# Patient Record
Sex: Male | Born: 1955
Health system: Southern US, Community
[De-identification: ages and names within clinical notes are randomized; demographics above are authoritative.]

## PROBLEM LIST (undated history)

## (undated) DIAGNOSIS — E785 Hyperlipidemia, unspecified: Secondary | ICD-10-CM

## (undated) DIAGNOSIS — C4491 Basal cell carcinoma of skin, unspecified: Secondary | ICD-10-CM

## (undated) DIAGNOSIS — R972 Elevated prostate specific antigen [PSA]: Secondary | ICD-10-CM

## (undated) DIAGNOSIS — K635 Polyp of colon: Secondary | ICD-10-CM

## (undated) DIAGNOSIS — A630 Anogenital (venereal) warts: Secondary | ICD-10-CM

## (undated) DIAGNOSIS — F419 Anxiety disorder, unspecified: Secondary | ICD-10-CM

## (undated) DIAGNOSIS — I10 Essential (primary) hypertension: Secondary | ICD-10-CM

## (undated) HISTORY — DX: Anxiety disorder, unspecified: F41.9

## (undated) HISTORY — PX: ANKLE SURGERY: SHX546

## (undated) HISTORY — DX: Basal cell carcinoma of skin, unspecified: C44.91

## (undated) HISTORY — PX: WRIST SURGERY: SHX841

## (undated) HISTORY — DX: Hyperlipidemia, unspecified: E78.5

## (undated) HISTORY — PX: PROSTATE BIOPSY: SHX241

## (undated) HISTORY — PX: HERNIA REPAIR: SHX51

## (undated) HISTORY — DX: Polyp of colon: K63.5

## (undated) HISTORY — DX: Anogenital (venereal) warts: A63.0

## (undated) HISTORY — PX: MOHS SURGERY: SUR867

## (undated) HISTORY — DX: Essential (primary) hypertension: I10

## (undated) HISTORY — DX: Elevated prostate specific antigen (PSA): R97.20

---
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
## 1986-08-12 HISTORY — PX: ANKLE SURGERY: SHX546

## 2015-05-17 ENCOUNTER — Encounter (HOSPITAL_COMMUNITY): Payer: Self-pay | Admitting: *Deleted

## 2015-05-17 ENCOUNTER — Emergency Department (HOSPITAL_COMMUNITY): Payer: BLUE CROSS/BLUE SHIELD

## 2015-05-17 ENCOUNTER — Emergency Department (HOSPITAL_COMMUNITY)
Admission: EM | Admit: 2015-05-17 | Discharge: 2015-05-18 | Disposition: A | Discharge status: Home / Self Care | Payer: BLUE CROSS/BLUE SHIELD | Attending: Emergency Medicine | Admitting: Emergency Medicine

## 2015-05-17 DIAGNOSIS — Z9889 Other specified postprocedural states: Secondary | ICD-10-CM | POA: Insufficient documentation

## 2015-05-17 DIAGNOSIS — R1010 Upper abdominal pain, unspecified: Secondary | ICD-10-CM | POA: Diagnosis not present

## 2015-05-17 DIAGNOSIS — Z79899 Other long term (current) drug therapy: Secondary | ICD-10-CM | POA: Diagnosis not present

## 2015-05-17 DIAGNOSIS — R079 Chest pain, unspecified: Secondary | ICD-10-CM | POA: Diagnosis present

## 2015-05-17 LAB — COMPREHENSIVE METABOLIC PANEL
ALBUMIN: 4.8 g/dL (ref 3.5–5.0)
ALT: 40 U/L (ref 17–63)
ANION GAP: 9 (ref 5–15)
AST: 42 U/L — ABNORMAL HIGH (ref 15–41)
Alkaline Phosphatase: 62 U/L (ref 38–126)
BUN: 14 mg/dL (ref 6–20)
CHLORIDE: 102 mmol/L (ref 101–111)
CO2: 29 mmol/L (ref 22–32)
Calcium: 9.2 mg/dL (ref 8.9–10.3)
Creatinine, Ser: 1.07 mg/dL (ref 0.61–1.24)
GFR calc Af Amer: >60 mL/min (ref >60)
GFR calc non Af Amer: >60 mL/min (ref >60)
GLUCOSE: 112 mg/dL — AB (ref 65–99)
POTASSIUM: 3.7 mmol/L (ref 3.5–5.1)
SODIUM: 140 mmol/L (ref 135–145)
Total Bilirubin: 0.9 mg/dL (ref 0.3–1.2)
Total Protein: 7.8 g/dL (ref 6.5–8.1)

## 2015-05-17 LAB — CBC
HEMATOCRIT: 44.1 % (ref 39.0–52.0)
Hemoglobin: 15.2 g/dL (ref 13.0–17.0)
MCH: 32 pg (ref 26.0–34.0)
MCHC: 34.5 g/dL (ref 30.0–36.0)
MCV: 92.8 fL (ref 78.0–100.0)
PLATELETS: 169 10*3/uL (ref 150–400)
RBC: 4.75 MIL/uL (ref 4.22–5.81)
RDW: 12.4 % (ref 11.5–15.5)
WBC: 9.5 10*3/uL (ref 4.0–10.5)

## 2015-05-17 LAB — I-STAT TROPONIN, ED: Troponin i, poc: 0.01 ng/mL (ref 0.00–0.08)

## 2015-05-17 LAB — LIPASE, BLOOD: Lipase: 22 U/L (ref 22–51)

## 2015-05-17 MED ORDER — ASPIRIN 81 MG PO CHEW
324.0000 mg | CHEWABLE_TABLET | Freq: Once | ORAL | Status: AC
  Administered 2015-05-17: 324 mg via ORAL
  Filled 2015-05-17: qty 4

## 2015-05-17 MED ORDER — GI COCKTAIL ~~LOC~~
30.0000 mL | Freq: Once | ORAL | Status: AC
  Administered 2015-05-17: 30 mL via ORAL
  Filled 2015-05-17: qty 30

## 2015-05-17 NOTE — ED Notes (Signed)
Pt states that around 1730 he began having burning and pressure to his chest; pt states it felt like something needed to "explode"; pt states that the pain radiated to his back; pt denies [facility] but states "It felt difficult to take a breath"; pt states that the pain has eased some but has not gone away

## 2015-05-17 NOTE — ED Provider Notes (Signed)
CSN: [id number]     Arrival date & time 05/17/15  2016 History   First MD Initiated Contact with Patient 05/17/15 2054     Chief Complaint  Patient presents with  . Chest Pain     (Consider location/radiation/quality/duration/timing/severity/associated sxs/prior Treatment) Patient is a 59 y.o. male presenting with chest pain. The history is provided by the patient.  Chest Pain Pain location:  Substernal area Pain quality: aching and dull   Pain quality comment:  Intense Pain radiates to:  Does not radiate Pain severity:  Severe Onset quality:  Sudden Duration: 3 hours. Timing:  Constant Progression:  Improving Chronicity:  New Relieved by:  Nothing Worsened by:  Nothing tried Ineffective treatments:  None tried Associated symptoms: no diaphoresis, no fever, no lower extremity edema, no shortness of breath and not vomiting     History reviewed. No pertinent past medical history. Past Surgical History  Procedure Laterality Date  . Hernia repair    . Ankle surgery     No family history on file. Social History  Substance Use Topics  . Smoking status: Never Smoker   . Smokeless tobacco: None  . Alcohol Use: Yes     Comment: socially    Review of Systems  Constitutional: Negative for fever and diaphoresis.  Respiratory: Negative for shortness of breath.   Cardiovascular: Positive for chest pain.  Gastrointestinal: Negative for vomiting.      Allergies  Quinine derivatives  Home Medications   Prior to Admission medications   Medication Sig Start Date End Date Taking? Authorizing Provider  Cholecalciferol (VITAMIN D) 2000 UNITS tablet Take 2,000 Units by mouth daily.   Yes Historical Provider, MD  LORazepam (ATIVAN) 0.5 MG tablet Take 0.5 mg by mouth every 6 (six) hours as needed for anxiety.   Yes Historical Provider, MD  Melatonin 5 MG TABS Take 1 tablet by mouth at bedtime.   Yes Historical Provider, MD  Omega-3 Fatty Acids (FISH OIL PO) Take 900 mg by mouth  daily.   Yes Historical Provider, MD   BP 161/73 mmHg  Pulse 58  Temp(Src) 98.3 F (36.8 C) (Oral)  Resp 16  Ht 5\' 10"  (1.778 m)  Wt 185 lb (83.915 kg)  BMI 26.54 kg/m2  SpO2 98% Physical Exam  Constitutional: He is oriented to person, place, and time. He appears well-developed and well-nourished. No distress.  HENT:  Head: Normocephalic and atraumatic.  Eyes: Conjunctivae are normal.  Neck: Neck supple. No tracheal deviation present.  Cardiovascular: Normal rate, regular rhythm and normal heart sounds.   Pulmonary/Chest: Effort normal and breath sounds normal. No respiratory distress. He has no wheezes. He has no rales. He exhibits no tenderness.  Abdominal: Soft. He exhibits no distension. There is tenderness (mild upper, non-focal). There is no rebound and no guarding.  Neurological: He is alert and oriented to person, place, and time.  Skin: Skin is warm and dry.  Psychiatric: He has a normal mood and affect.    ED Course  Procedures (including critical care time) Labs Review Labs Reviewed  COMPREHENSIVE METABOLIC PANEL - Abnormal; Notable for the following:    Glucose, Bld 112 (*)    AST 42 (*)    All other components within normal limits  CBC  LIPASE, BLOOD  I-STAT TROPOININ, ED    Imaging Review Dg Chest 2 View  05/17/2015   CLINICAL DATA:  Acute onset of mid chest pain.  Initial encounter.  EXAM: CHEST  2 VIEW  COMPARISON:  None.  FINDINGS: The lungs are well-aerated and clear. There is no evidence of focal opacification, pleural effusion or pneumothorax.  The heart is normal in size; the mediastinal contour is within normal limits. No acute osseous abnormalities are seen.  IMPRESSION: No acute cardiopulmonary process seen.   Electronically Signed   By: Garald Balding M.D.   On: 05/17/2015 21:31   I have personally reviewed and evaluated these images and lab results as part of my medical decision-making.   EKG Interpretation   Date/Time:  Wednesday May 17 2015 20:42:16 EDT Ventricular Rate:  57 PR Interval:  147 QRS Duration: 114 QT Interval:  448 QTC Calculation: 436 R Axis:   -35 Text Interpretation:  Sinus rhythm Borderline IVCD with LAD No previous  tracing Confirmed by Jupiter Boys MD, Quillian Quince ([id number]) on 05/17/2015 9:23:40 PM      MDM   Final diagnoses:  Chest pain, unspecified chest pain type  Upper abdominal pain    59 year old male presents with chest pain that started suddenly around 5:30 PM, felt like an intense pressure over his sternal area. It migrated into his upper abdomen. He had some mild tenderness was noted by his wife who is at bedside who happens to be a family physician. Patient has heart score of 1 and no signs of ischemia on EKG. Pain is very atypical for PE and patient is Wells of 0. No evidence of pancreatitis, acute gallbladder dysfunction, nonfocal tenderness of upper abdomen more likely related to indigestion or similar. First troponin is negative but due to onset of symptoms less than 6 hours prior to arrival patient will require serial troponin examination for low risk ACS rule out.  Second troponin ordered for 12:45 AM, discharge if negative.    Leo Grosser, MD 05/17/15 [phone]

## 2015-05-18 LAB — I-STAT TROPONIN, ED: TROPONIN I, POC: 0.04 ng/mL (ref 0.00–0.08)

## 2015-05-18 NOTE — Discharge Instructions (Signed)
Nonspecific Chest Pain  °Chest pain can be caused by many different conditions. There is always a chance that your pain could be related to something serious, such as a heart attack or a blood clot in your lungs. Chest pain can also be caused by conditions that are not life-threatening. If you have chest pain, it is very important to follow up with your health care provider. °CAUSES  °Chest pain can be caused by: °· Heartburn. °· Pneumonia or bronchitis. °· Anxiety or stress. °· Inflammation around your heart (pericarditis) or lung (pleuritis or pleurisy). °· A blood clot in your lung. °· A collapsed lung (pneumothorax). It can develop suddenly on its own (spontaneous pneumothorax) or from trauma to the chest. °· Shingles infection (varicella-zoster virus). °· Heart attack. °· Damage to the bones, muscles, and cartilage that make up your chest wall. This can include: °¨ Bruised bones due to injury. °¨ Strained muscles or cartilage due to frequent or repeated coughing or overwork. °¨ Fracture to one or more ribs. °¨ Sore cartilage due to inflammation (costochondritis). °RISK FACTORS  °Risk factors for chest pain may include: °· Activities that increase your risk for trauma or injury to your chest. °· Respiratory infections or conditions that cause frequent coughing. °· Medical conditions or overeating that can cause heartburn. °· Heart disease or family history of heart disease. °· Conditions or health behaviors that increase your risk of developing a blood clot. °· Having had chicken pox (varicella zoster). °SIGNS AND SYMPTOMS °Chest pain can feel like: °· Burning or tingling on the surface of your chest or deep in your chest. °· Crushing, pressure, aching, or squeezing pain. °· Dull or sharp pain that is worse when you move, cough, or take a deep breath. °· Pain that is also felt in your back, neck, shoulder, or arm, or pain that spreads to any of these areas. °Your chest pain may come and go, or it may stay  constant. °DIAGNOSIS °Lab tests or other studies may be needed to find the cause of your pain. Your health care provider may have you take a test called an ambulatory ECG (electrocardiogram). An ECG records your heartbeat patterns at the time the test is performed. You may also have other tests, such as: °· Transthoracic echocardiogram (TTE). During echocardiography, sound waves are used to create a picture of all of the heart structures and to look at how blood flows through your heart. °· Transesophageal echocardiogram (TEE). This is a more advanced imaging test that obtains images from inside your body. It allows your health care provider to see your heart in finer detail. °· Cardiac monitoring. This allows your health care provider to monitor your heart rate and rhythm in real time. °· Holter monitor. This is a portable device that records your heartbeat and can help to diagnose abnormal heartbeats. It allows your health care provider to track your heart activity for several days, if needed. °· Stress tests. These can be done through exercise or by taking medicine that makes your heart beat more quickly. °· Blood tests. °· Imaging tests. °TREATMENT  °Your treatment depends on what is causing your chest pain. Treatment may include: °· Medicines. These may include: °¨ Acid blockers for heartburn. °¨ Anti-inflammatory medicine. °¨ Pain medicine for inflammatory conditions. °¨ Antibiotic medicine, if an infection is present. °¨ Medicines to dissolve blood clots. °¨ Medicines to treat coronary artery disease. °· Supportive care for conditions that do not require medicines. This may include: °¨ Resting. °¨ Applying heat   or cold packs to injured areas. °¨ Limiting activities until pain decreases. °HOME CARE INSTRUCTIONS °· If you were prescribed an antibiotic medicine, finish it all even if you start to feel better. °· Avoid any activities that bring on chest pain. °· Do not use any tobacco products, including  cigarettes, chewing tobacco, or electronic cigarettes. If you need help quitting, ask your health care provider. °· Do not drink alcohol. °· Take medicines only as directed by your health care provider. °· Keep all follow-up visits as directed by your health care provider. This is important. This includes any further testing if your chest pain does not go away. °· If heartburn is the cause for your chest pain, you may be told to keep your head raised (elevated) while sleeping. This reduces the chance that acid will go from your stomach into your esophagus. °· Make lifestyle changes as directed by your health care provider. These may include: °¨ Getting regular exercise. Ask your health care provider to suggest some activities that are safe for you. °¨ Eating a heart-healthy diet. A registered dietitian can help you to learn healthy eating options. °¨ Maintaining a healthy weight. °¨ Managing diabetes, if necessary. °¨ Reducing stress. °SEEK MEDICAL CARE IF: °· Your chest pain does not go away after treatment. °· You have a rash with blisters on your chest. °· You have a fever. °SEEK IMMEDIATE MEDICAL CARE IF:  °· Your chest pain is worse. °· You have an increasing cough, or you cough up blood. °· You have severe abdominal pain. °· You have severe weakness. °· You faint. °· You have chills. °· You have sudden, unexplained chest discomfort. °· You have sudden, unexplained discomfort in your arms, back, neck, or jaw. °· You have shortness of breath at any time. °· You suddenly start to sweat, or your skin gets clammy. °· You feel nauseous or you vomit. °· You suddenly feel light-headed or dizzy. °· Your heart begins to beat quickly, or it feels like it is skipping beats. °These symptoms may represent a serious problem that is an emergency. Do not wait to see if the symptoms will go away. Get medical help right away. Call your local emergency services (911 in the [place] Do not drive yourself to the hospital. °  °This  information is not intended to replace advice given to you by your health care provider. Make sure you discuss any questions you have with your health care provider. °  °Document Released: 05/08/2005 Document Revised: 08/19/2014 Document Reviewed: 03/04/2014 °Elsevier Interactive Patient Education ©2016 [employer] ° °

## 2016-11-12 IMAGING — CR DG CHEST 2V
2 series · 2 of 2 positions shown · non-contrast
Comparison: None.

CLINICAL DATA: Acute onset of mid chest pain.  Initial encounter.

EXAM:
CHEST  2 VIEW

[w chest pa]
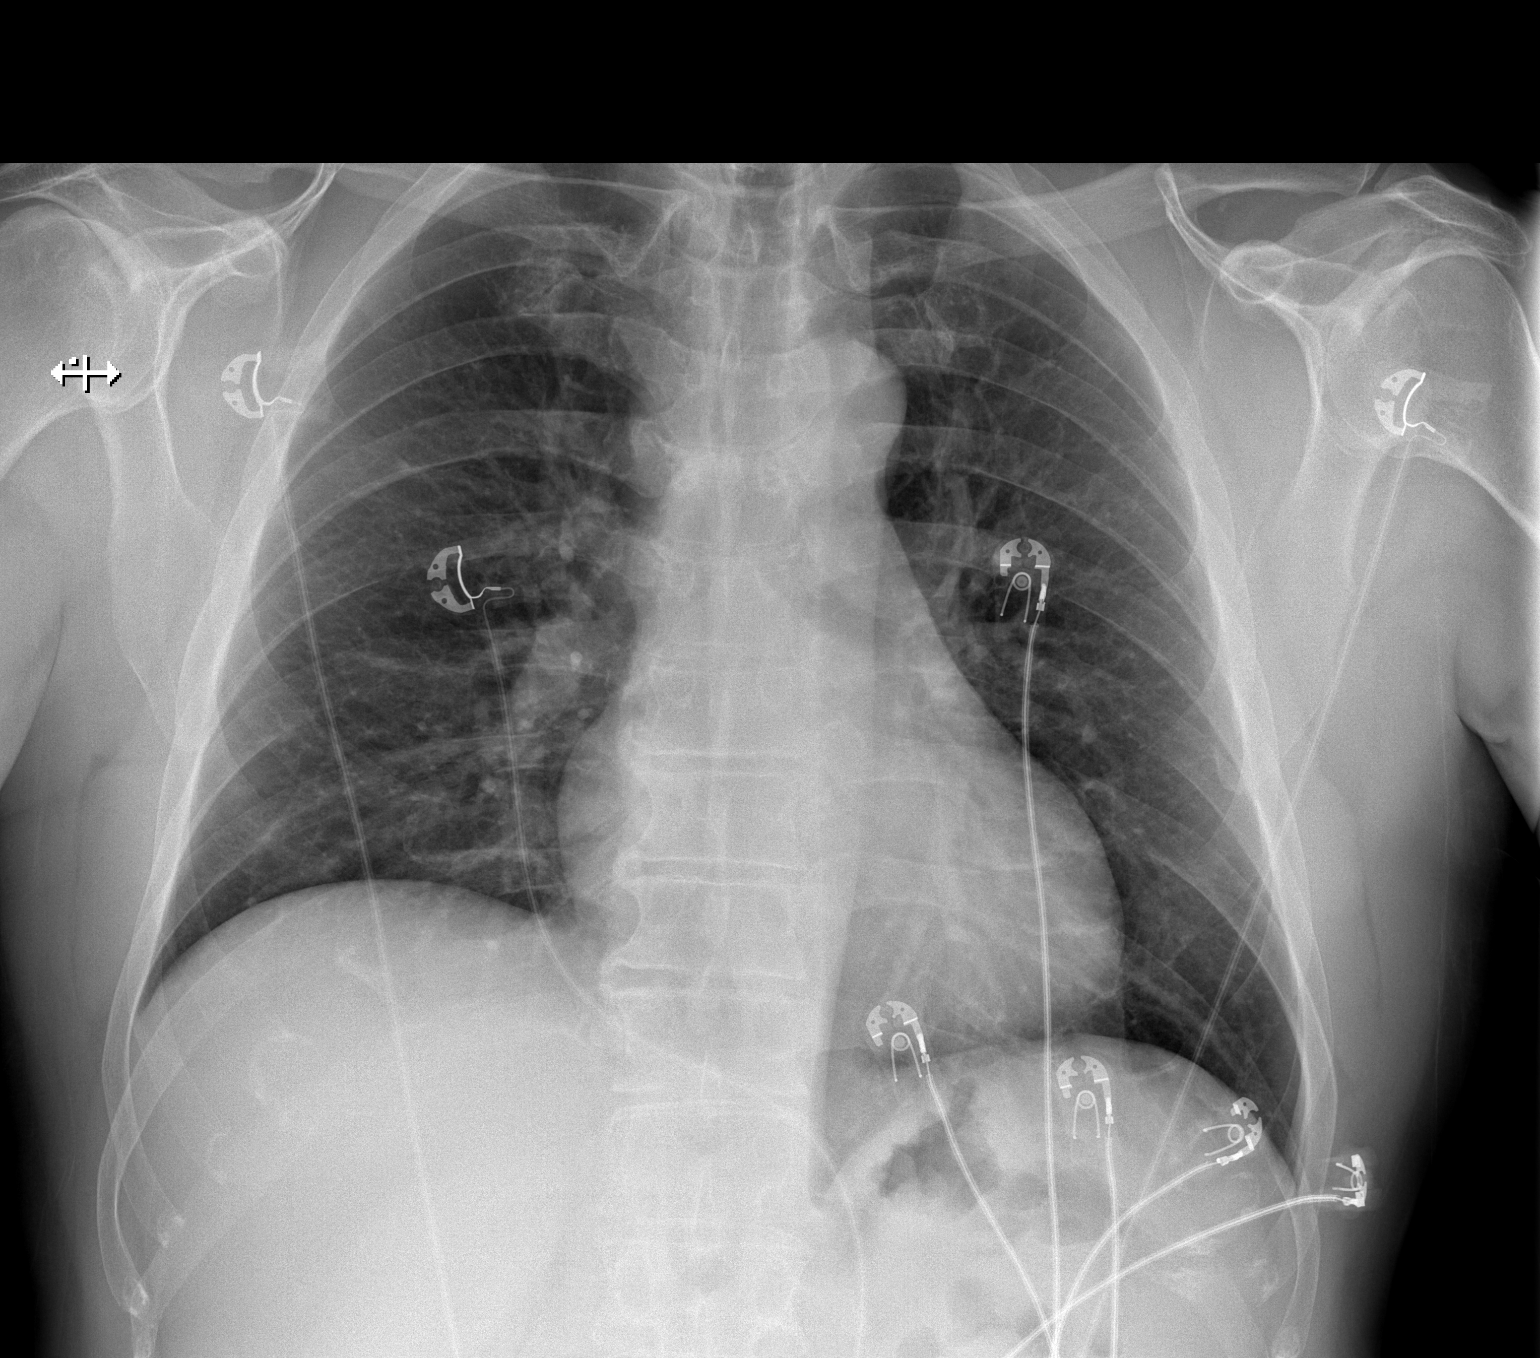

[w chest lat]
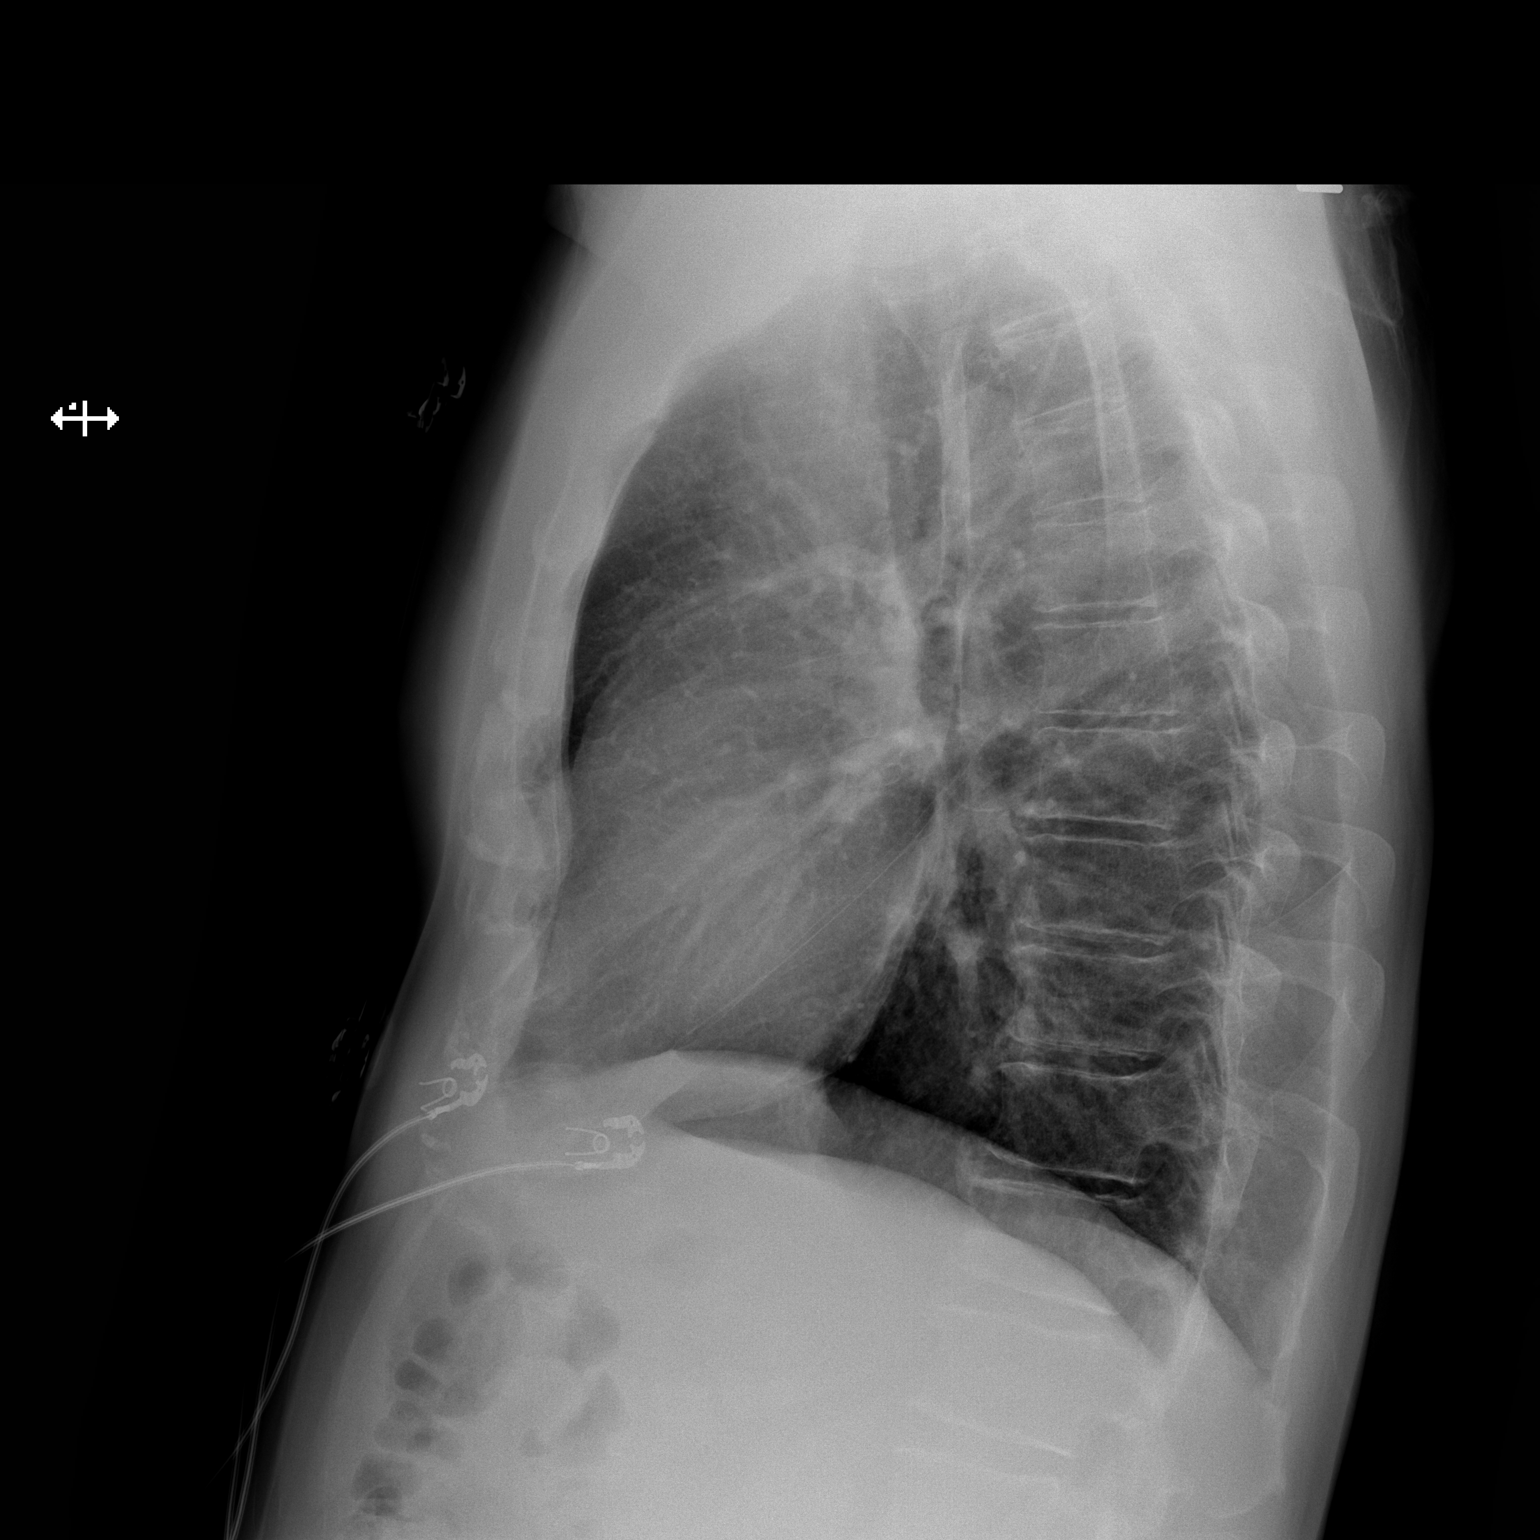

[2 of 2 positions shown; findings below may reference images not displayed]

FINDINGS: The lungs are well-aerated and clear. There is no evidence of focal
opacification, pleural effusion or pneumothorax.

The heart is normal in size; the mediastinal contour is within
normal limits. No acute osseous abnormalities are seen.
IMPRESSION: No acute cardiopulmonary process seen.

## 2017-02-06 DIAGNOSIS — R030 Elevated blood-pressure reading, without diagnosis of hypertension: Secondary | ICD-10-CM | POA: Diagnosis not present

## 2017-02-06 DIAGNOSIS — Z125 Encounter for screening for malignant neoplasm of prostate: Secondary | ICD-10-CM | POA: Diagnosis not present

## 2017-02-06 DIAGNOSIS — Z0000 Encounter for general adult medical examination without abnormal findings: Secondary | ICD-10-CM | POA: Diagnosis not present

## 2017-02-06 DIAGNOSIS — Z130 Encounter for screening for diseases of the blood and blood-forming organs and certain disorders involving the immune mechanism: Secondary | ICD-10-CM | POA: Diagnosis not present

## 2017-02-06 DIAGNOSIS — Z13220 Encounter for screening for lipoid disorders: Secondary | ICD-10-CM | POA: Diagnosis not present

## 2017-03-28 DIAGNOSIS — Z800 Family history of malignant neoplasm of digestive organs: Secondary | ICD-10-CM | POA: Diagnosis not present

## 2017-06-24 DIAGNOSIS — D485 Neoplasm of uncertain behavior of skin: Secondary | ICD-10-CM | POA: Diagnosis not present

## 2017-06-24 DIAGNOSIS — L989 Disorder of the skin and subcutaneous tissue, unspecified: Secondary | ICD-10-CM | POA: Diagnosis not present

## 2017-06-24 DIAGNOSIS — C4441 Basal cell carcinoma of skin of scalp and neck: Secondary | ICD-10-CM | POA: Diagnosis not present

## 2017-06-24 DIAGNOSIS — C44319 Basal cell carcinoma of skin of other parts of face: Secondary | ICD-10-CM | POA: Diagnosis not present

## 2017-08-12 DIAGNOSIS — C4442 Squamous cell carcinoma of skin of scalp and neck: Secondary | ICD-10-CM

## 2017-08-12 HISTORY — DX: Squamous cell carcinoma of skin of scalp and neck: C44.42

## 2017-08-12 HISTORY — PX: MOHS SURGERY: SUR867

## 2017-08-14 DIAGNOSIS — C44319 Basal cell carcinoma of skin of other parts of face: Secondary | ICD-10-CM | POA: Diagnosis not present

## 2017-08-14 DIAGNOSIS — C4441 Basal cell carcinoma of skin of scalp and neck: Secondary | ICD-10-CM | POA: Diagnosis not present

## 2018-02-18 DIAGNOSIS — Z0000 Encounter for general adult medical examination without abnormal findings: Secondary | ICD-10-CM | POA: Diagnosis not present

## 2018-02-18 DIAGNOSIS — R972 Elevated prostate specific antigen [PSA]: Secondary | ICD-10-CM | POA: Diagnosis not present

## 2018-02-18 DIAGNOSIS — E782 Mixed hyperlipidemia: Secondary | ICD-10-CM | POA: Diagnosis not present

## 2018-02-18 DIAGNOSIS — Z1321 Encounter for screening for nutritional disorder: Secondary | ICD-10-CM | POA: Diagnosis not present

## 2018-05-22 DIAGNOSIS — Z800 Family history of malignant neoplasm of digestive organs: Secondary | ICD-10-CM | POA: Diagnosis not present

## 2018-05-22 DIAGNOSIS — D123 Benign neoplasm of transverse colon: Secondary | ICD-10-CM | POA: Diagnosis not present

## 2018-05-22 DIAGNOSIS — Z1211 Encounter for screening for malignant neoplasm of colon: Secondary | ICD-10-CM | POA: Diagnosis not present

## 2018-05-22 LAB — HM COLONOSCOPY

## 2018-06-12 DIAGNOSIS — C4442 Squamous cell carcinoma of skin of scalp and neck: Secondary | ICD-10-CM | POA: Diagnosis not present

## 2018-06-12 DIAGNOSIS — L570 Actinic keratosis: Secondary | ICD-10-CM | POA: Diagnosis not present

## 2018-06-12 DIAGNOSIS — L0889 Other specified local infections of the skin and subcutaneous tissue: Secondary | ICD-10-CM | POA: Diagnosis not present

## 2018-06-23 DIAGNOSIS — C4442 Squamous cell carcinoma of skin of scalp and neck: Secondary | ICD-10-CM | POA: Diagnosis not present

## 2018-06-24 DIAGNOSIS — C4442 Squamous cell carcinoma of skin of scalp and neck: Secondary | ICD-10-CM | POA: Insufficient documentation

## 2018-06-24 DIAGNOSIS — Z888 Allergy status to other drugs, medicaments and biological substances status: Secondary | ICD-10-CM | POA: Diagnosis not present

## 2018-06-26 DIAGNOSIS — D6489 Other specified anemias: Secondary | ICD-10-CM | POA: Diagnosis not present

## 2018-06-26 DIAGNOSIS — C4490 Unspecified malignant neoplasm of skin, unspecified: Secondary | ICD-10-CM | POA: Diagnosis not present

## 2018-06-26 DIAGNOSIS — E559 Vitamin D deficiency, unspecified: Secondary | ICD-10-CM | POA: Diagnosis not present

## 2018-06-26 DIAGNOSIS — R799 Abnormal finding of blood chemistry, unspecified: Secondary | ICD-10-CM | POA: Diagnosis not present

## 2018-07-03 DIAGNOSIS — C4442 Squamous cell carcinoma of skin of scalp and neck: Secondary | ICD-10-CM | POA: Diagnosis not present

## 2018-07-04 DIAGNOSIS — T8189XA Other complications of procedures, not elsewhere classified, initial encounter: Secondary | ICD-10-CM | POA: Diagnosis not present

## 2018-07-04 DIAGNOSIS — C4442 Squamous cell carcinoma of skin of scalp and neck: Secondary | ICD-10-CM | POA: Diagnosis not present

## 2018-07-05 DIAGNOSIS — T8189XA Other complications of procedures, not elsewhere classified, initial encounter: Secondary | ICD-10-CM | POA: Diagnosis not present

## 2018-07-06 DIAGNOSIS — T8189XA Other complications of procedures, not elsewhere classified, initial encounter: Secondary | ICD-10-CM | POA: Diagnosis not present

## 2018-07-07 DIAGNOSIS — T8189XA Other complications of procedures, not elsewhere classified, initial encounter: Secondary | ICD-10-CM | POA: Diagnosis not present

## 2018-07-08 DIAGNOSIS — T8189XA Other complications of procedures, not elsewhere classified, initial encounter: Secondary | ICD-10-CM | POA: Diagnosis not present

## 2018-07-09 DIAGNOSIS — T8189XA Other complications of procedures, not elsewhere classified, initial encounter: Secondary | ICD-10-CM | POA: Diagnosis not present

## 2018-07-10 DIAGNOSIS — T8189XA Other complications of procedures, not elsewhere classified, initial encounter: Secondary | ICD-10-CM | POA: Diagnosis not present

## 2018-07-11 DIAGNOSIS — T8189XA Other complications of procedures, not elsewhere classified, initial encounter: Secondary | ICD-10-CM | POA: Diagnosis not present

## 2018-07-12 DIAGNOSIS — T8189XA Other complications of procedures, not elsewhere classified, initial encounter: Secondary | ICD-10-CM | POA: Diagnosis not present

## 2018-07-13 DIAGNOSIS — T8189XA Other complications of procedures, not elsewhere classified, initial encounter: Secondary | ICD-10-CM | POA: Diagnosis not present

## 2018-07-14 DIAGNOSIS — T8189XA Other complications of procedures, not elsewhere classified, initial encounter: Secondary | ICD-10-CM | POA: Diagnosis not present

## 2018-08-07 DIAGNOSIS — C4442 Squamous cell carcinoma of skin of scalp and neck: Secondary | ICD-10-CM | POA: Diagnosis not present

## 2018-08-08 DIAGNOSIS — C4442 Squamous cell carcinoma of skin of scalp and neck: Secondary | ICD-10-CM | POA: Diagnosis not present

## 2018-08-12 DIAGNOSIS — C439 Malignant melanoma of skin, unspecified: Secondary | ICD-10-CM | POA: Insufficient documentation

## 2019-02-10 DIAGNOSIS — J029 Acute pharyngitis, unspecified: Secondary | ICD-10-CM | POA: Diagnosis not present

## 2019-02-10 DIAGNOSIS — Z20828 Contact with and (suspected) exposure to other viral communicable diseases: Secondary | ICD-10-CM | POA: Diagnosis not present

## 2019-02-22 DIAGNOSIS — N401 Enlarged prostate with lower urinary tract symptoms: Secondary | ICD-10-CM | POA: Diagnosis not present

## 2019-02-22 DIAGNOSIS — Z0000 Encounter for general adult medical examination without abnormal findings: Secondary | ICD-10-CM | POA: Diagnosis not present

## 2019-02-22 DIAGNOSIS — E7800 Pure hypercholesterolemia, unspecified: Secondary | ICD-10-CM | POA: Diagnosis not present

## 2019-02-22 DIAGNOSIS — E559 Vitamin D deficiency, unspecified: Secondary | ICD-10-CM | POA: Diagnosis not present

## 2019-05-21 DIAGNOSIS — N400 Enlarged prostate without lower urinary tract symptoms: Secondary | ICD-10-CM | POA: Diagnosis not present

## 2019-05-21 DIAGNOSIS — E291 Testicular hypofunction: Secondary | ICD-10-CM | POA: Diagnosis not present

## 2019-05-21 DIAGNOSIS — C4490 Unspecified malignant neoplasm of skin, unspecified: Secondary | ICD-10-CM | POA: Diagnosis not present

## 2019-06-02 DIAGNOSIS — C4359 Malignant melanoma of other part of trunk: Secondary | ICD-10-CM | POA: Diagnosis not present

## 2019-06-02 DIAGNOSIS — Z85828 Personal history of other malignant neoplasm of skin: Secondary | ICD-10-CM | POA: Diagnosis not present

## 2019-06-02 DIAGNOSIS — L723 Sebaceous cyst: Secondary | ICD-10-CM | POA: Diagnosis not present

## 2019-06-02 DIAGNOSIS — R21 Rash and other nonspecific skin eruption: Secondary | ICD-10-CM | POA: Diagnosis not present

## 2019-06-02 DIAGNOSIS — L821 Other seborrheic keratosis: Secondary | ICD-10-CM | POA: Diagnosis not present

## 2019-06-21 DIAGNOSIS — L988 Other specified disorders of the skin and subcutaneous tissue: Secondary | ICD-10-CM | POA: Diagnosis not present

## 2019-06-21 DIAGNOSIS — C4359 Malignant melanoma of other part of trunk: Secondary | ICD-10-CM | POA: Diagnosis not present

## 2019-07-07 DIAGNOSIS — R351 Nocturia: Secondary | ICD-10-CM | POA: Diagnosis not present

## 2019-07-07 DIAGNOSIS — R972 Elevated prostate specific antigen [PSA]: Secondary | ICD-10-CM | POA: Diagnosis not present

## 2019-07-13 DIAGNOSIS — R972 Elevated prostate specific antigen [PSA]: Secondary | ICD-10-CM

## 2019-07-13 HISTORY — PX: PROSTATE BIOPSY: SHX241

## 2019-07-13 HISTORY — DX: Elevated prostate specific antigen (PSA): R97.20

## 2019-07-19 DIAGNOSIS — R972 Elevated prostate specific antigen [PSA]: Secondary | ICD-10-CM | POA: Diagnosis not present

## 2019-08-16 DIAGNOSIS — D225 Melanocytic nevi of trunk: Secondary | ICD-10-CM | POA: Diagnosis not present

## 2019-08-16 DIAGNOSIS — D485 Neoplasm of uncertain behavior of skin: Secondary | ICD-10-CM | POA: Diagnosis not present

## 2019-08-16 DIAGNOSIS — Z85820 Personal history of malignant melanoma of skin: Secondary | ICD-10-CM | POA: Diagnosis not present

## 2019-08-16 DIAGNOSIS — Z85828 Personal history of other malignant neoplasm of skin: Secondary | ICD-10-CM | POA: Diagnosis not present

## 2019-09-14 DIAGNOSIS — Z20828 Contact with and (suspected) exposure to other viral communicable diseases: Secondary | ICD-10-CM | POA: Diagnosis not present

## 2020-01-26 DIAGNOSIS — B349 Viral infection, unspecified: Secondary | ICD-10-CM | POA: Diagnosis not present

## 2020-03-15 DIAGNOSIS — D485 Neoplasm of uncertain behavior of skin: Secondary | ICD-10-CM | POA: Diagnosis not present

## 2020-03-15 DIAGNOSIS — Z85828 Personal history of other malignant neoplasm of skin: Secondary | ICD-10-CM | POA: Diagnosis not present

## 2020-03-15 DIAGNOSIS — Z85820 Personal history of malignant melanoma of skin: Secondary | ICD-10-CM | POA: Diagnosis not present

## 2020-03-15 DIAGNOSIS — D225 Melanocytic nevi of trunk: Secondary | ICD-10-CM | POA: Diagnosis not present

## 2020-05-11 DIAGNOSIS — Z0000 Encounter for general adult medical examination without abnormal findings: Secondary | ICD-10-CM | POA: Diagnosis not present

## 2020-05-11 DIAGNOSIS — R972 Elevated prostate specific antigen [PSA]: Secondary | ICD-10-CM | POA: Diagnosis not present

## 2020-05-11 DIAGNOSIS — E782 Mixed hyperlipidemia: Secondary | ICD-10-CM | POA: Diagnosis not present

## 2020-05-11 LAB — VITAMIN D 25 HYDROXY (VIT D DEFICIENCY, FRACTURES): Vit D, 25-Hydroxy: 35

## 2020-05-11 LAB — LIPID PANEL
Cholesterol: 266 — AB (ref 0–200)
HDL: 73 — AB (ref 35–70)
LDL Cholesterol: 179
Triglycerides: 84 (ref 40–160)

## 2020-05-11 LAB — PSA: PSA: 5.8

## 2020-07-10 ENCOUNTER — Ambulatory Visit: Payer: BC Managed Care – PPO | Attending: Internal Medicine

## 2020-07-10 DIAGNOSIS — Z23 Encounter for immunization: Secondary | ICD-10-CM

## 2020-07-10 NOTE — Progress Notes (Signed)
   Covid-19 Vaccination Clinic  Name:  Brian Marshall    MRN: [id number] DOB: Dec 19, 1955  07/10/2020  [name] was observed post Covid-19 immunization for 15 minutes without incident. He was provided with Vaccine Information Sheet and instruction to access the V-Safe system.   [name] was instructed to call 911 with any severe reactions post vaccine: Marland Kitchen Difficulty breathing  . Swelling of face and throat  . A fast heartbeat  . A bad rash all over body  . Dizziness and weakness   Immunizations Administered    No immunizations on file.

## 2020-07-24 DIAGNOSIS — B349 Viral infection, unspecified: Secondary | ICD-10-CM | POA: Diagnosis not present

## 2020-09-27 DIAGNOSIS — D2261 Melanocytic nevi of right upper limb, including shoulder: Secondary | ICD-10-CM | POA: Diagnosis not present

## 2020-09-27 DIAGNOSIS — D225 Melanocytic nevi of trunk: Secondary | ICD-10-CM | POA: Diagnosis not present

## 2020-09-27 DIAGNOSIS — Z85828 Personal history of other malignant neoplasm of skin: Secondary | ICD-10-CM | POA: Diagnosis not present

## 2020-09-27 DIAGNOSIS — Z85820 Personal history of malignant melanoma of skin: Secondary | ICD-10-CM | POA: Diagnosis not present

## 2021-02-19 ENCOUNTER — Emergency Department (HOSPITAL_COMMUNITY): Payer: BC Managed Care – PPO

## 2021-02-19 ENCOUNTER — Other Ambulatory Visit: Payer: Self-pay

## 2021-02-19 ENCOUNTER — Emergency Department (HOSPITAL_COMMUNITY)
Admission: EM | Admit: 2021-02-19 | Discharge: 2021-02-19 | Disposition: A | Discharge status: Home / Self Care | Payer: BC Managed Care – PPO | Attending: Emergency Medicine | Admitting: Emergency Medicine

## 2021-02-19 DIAGNOSIS — R9431 Abnormal electrocardiogram [ECG] [EKG]: Secondary | ICD-10-CM

## 2021-02-19 DIAGNOSIS — R002 Palpitations: Secondary | ICD-10-CM | POA: Diagnosis not present

## 2021-02-19 DIAGNOSIS — K760 Fatty (change of) liver, not elsewhere classified: Secondary | ICD-10-CM | POA: Diagnosis not present

## 2021-02-19 DIAGNOSIS — R5381 Other malaise: Secondary | ICD-10-CM | POA: Diagnosis not present

## 2021-02-19 DIAGNOSIS — R748 Abnormal levels of other serum enzymes: Secondary | ICD-10-CM | POA: Diagnosis not present

## 2021-02-19 DIAGNOSIS — I493 Ventricular premature depolarization: Secondary | ICD-10-CM

## 2021-02-19 DIAGNOSIS — R197 Diarrhea, unspecified: Secondary | ICD-10-CM | POA: Diagnosis not present

## 2021-02-19 DIAGNOSIS — R030 Elevated blood-pressure reading, without diagnosis of hypertension: Secondary | ICD-10-CM | POA: Diagnosis not present

## 2021-02-19 LAB — COMPREHENSIVE METABOLIC PANEL
ALT: 66 U/L — ABNORMAL HIGH (ref 0–44)
AST: 62 U/L — ABNORMAL HIGH (ref 15–41)
Albumin: 4.5 g/dL (ref 3.5–5.0)
Alkaline Phosphatase: 51 U/L (ref 38–126)
Anion gap: 11 (ref 5–15)
BUN: 5 mg/dL — ABNORMAL LOW (ref 8–23)
CO2: 27 mmol/L (ref 22–32)
Calcium: 9.8 mg/dL (ref 8.9–10.3)
Chloride: 98 mmol/L (ref 98–111)
Creatinine, Ser: 1.14 mg/dL (ref 0.61–1.24)
GFR, Estimated: >60 mL/min (ref >60)
Glucose, Bld: 132 mg/dL — ABNORMAL HIGH (ref 70–99)
Potassium: 3.9 mmol/L (ref 3.5–5.1)
Sodium: 136 mmol/L (ref 135–145)
Total Bilirubin: 1.4 mg/dL — ABNORMAL HIGH (ref 0.3–1.2)
Total Protein: 7.3 g/dL (ref 6.5–8.1)

## 2021-02-19 LAB — CBC WITH DIFFERENTIAL/PLATELET
Abs Immature Granulocytes: 0.03 10*3/uL (ref 0.00–0.07)
Basophils Absolute: 0 10*3/uL (ref 0.0–0.1)
Basophils Relative: 0 %
Eosinophils Absolute: 0.1 10*3/uL (ref 0.0–0.5)
Eosinophils Relative: 1 %
HCT: 42.5 % (ref 39.0–52.0)
Hemoglobin: 14.5 g/dL (ref 13.0–17.0)
Immature Granulocytes: 1 %
Lymphocytes Relative: 13 %
Lymphs Abs: 0.6 10*3/uL — ABNORMAL LOW (ref 0.7–4.0)
MCH: 33.5 pg (ref 26.0–34.0)
MCHC: 34.1 g/dL (ref 30.0–36.0)
MCV: 98.2 fL (ref 80.0–100.0)
Monocytes Absolute: 0.8 10*3/uL (ref 0.1–1.0)
Monocytes Relative: 16 %
Neutro Abs: 3.3 10*3/uL (ref 1.7–7.7)
Neutrophils Relative %: 69 %
Platelets: 170 10*3/uL (ref 150–400)
RBC: 4.33 MIL/uL (ref 4.22–5.81)
RDW: 12 % (ref 11.5–15.5)
WBC: 4.7 10*3/uL (ref 4.0–10.5)
nRBC: 0 % (ref 0.0–0.2)

## 2021-02-19 LAB — TROPONIN I (HIGH SENSITIVITY)
Troponin I (High Sensitivity): 4 ng/L (ref <18)
Troponin I (High Sensitivity): 4 ng/L (ref <18)

## 2021-02-19 NOTE — ED Provider Notes (Signed)
[facility]ERGENC[facility] DEPART[facility] Provider Note   CSN: [id number] Arrival date & time: 02/19/21  1247     History Chief Complaint  Patient presents with   Abnormal ECG    Brian Marshall is a 65 y.o. male.  HPI  Patient presented to the ED for evaluation of an abnormal outpatient EKG.  Patient states he has been feeling somewhat off for the last day or 2.  Patient states he felt like there was a pressure in his head as if his ear is needed to pop.  Patient also had noted that he was feeling his heartbeat.  He does have trouble with chronic tinnitus and it has been worse recently.  He has not been have any numbness.  No visual changes.  No abdominal pain.  He denies having any chest pressure.  No shortness of breath.  No chest pain.  No past medical history on file.  There are no problems to display for this patient.   Past Surgical History:  Procedure Laterality Date   ANKLE SURGERY     HERNIA REPAIR         No family history on file.  Social History   Tobacco Use   Smoking status: Never  Substance Use Topics   Alcohol use: Yes    Comment: socially   Drug use: No    Home Medications Prior to Admission medications   Medication Sig Start Date End Date Taking? Authorizing Provider  Cholecalciferol (VITAMIN D) 2000 UNITS tablet Take 2,000 Units by mouth daily.    [provider]  LORazepam (ATIVAN) 0.5 MG tablet Take 0.5 mg by mouth every 6 (six) hours as needed for anxiety.    [provider]  Melatonin 5 MG TABS Take 1 tablet by mouth at bedtime.    [provider]  Omega-3 Fatty Acids (FISH OIL PO) Take 900 mg by mouth daily.    [provider]    Allergies    Quinine derivatives  Review of Systems   Review of Systems  All other systems reviewed and are negative.  Physical Exam Updated Vital Signs BP 134/76 (BP Location: Left Arm)   Pulse 66   Temp 98.1 F (36.7 C)   Resp 16   Ht 1.753 m (5\' 9" )    Wt 80.7 kg   SpO2 98%   BMI 26.29 kg/m   Physical Exam Vitals and nursing note reviewed.  Constitutional:      General: He is not in acute distress.    Appearance: He is well-developed.  HENT:     Head: Normocephalic.     Comments: Scar from basal cell carcinoma on the top of the scalp    Right Ear: External ear normal.     Left Ear: External ear normal.  Eyes:     General: No scleral icterus.       Right eye: No discharge.        Left eye: No discharge.     Conjunctiva/sclera: Conjunctivae normal.  Neck:     Trachea: No tracheal deviation.  Cardiovascular:     Rate and Rhythm: Normal rate and regular rhythm.  Pulmonary:     Effort: Pulmonary effort is normal. No respiratory distress.     Breath sounds: Normal breath sounds. No stridor. No wheezing or rales.  Abdominal:     General: Bowel sounds are normal. There is no distension.     Palpations: Abdomen is soft.     Tenderness: There is  no abdominal tenderness. There is no guarding or rebound.  Musculoskeletal:        General: No tenderness or deformity.     Cervical back: Neck supple.  Skin:    General: Skin is warm and dry.     Findings: No rash.  Neurological:     General: No focal deficit present.     Mental Status: He is alert.     Cranial Nerves: No cranial nerve deficit (no facial droop, extraocular movements intact, no slurred speech).     Sensory: No sensory deficit.     Motor: No abnormal muscle tone or seizure activity.     Coordination: Coordination normal.  Psychiatric:        Mood and Affect: Mood normal.    ED Results / Procedures / Treatments   Labs (all labs ordered are listed, but only abnormal results are displayed) Labs Reviewed  COMPREHENSIVE METABOLIC PANEL - Abnormal; Notable for the following components:      Result Value   Glucose, Bld 132 (*)    BUN 5 (*)    AST 62 (*)    ALT 66 (*)    Total Bilirubin 1.4 (*)    All other components within normal limits  CBC WITH  DIFFERENTIAL/PLATELET - Abnormal; Notable for the following components:   Lymphs Abs 0.6 (*)    All other components within normal limits  TROPONIN I (HIGH SENSITIVITY)  TROPONIN I (HIGH SENSITIVITY)    EKG EKG Interpretation  Date/Time:  Monday February 19 2021 13:00:55 EDT Ventricular Rate:  82 PR Interval:  130 QRS Duration: 100 QT Interval:  372 QTC Calculation: 434 R Axis:   -24 Text Interpretation: Sinus rhythm with occasional Premature ventricular complexes and Premature atrial complexes , new since last tracing Minimal voltage criteria for LVH, may be normal variant ( Cornell product ) Borderline ECG Confirmed by Dorie Rank [phone]) on 02/19/2021 9:47:23 PM  Radiology DG Chest 1 View  Result Date: 02/19/2021 CLINICAL DATA:  Abnormal EKG EXAM: CHEST  1 VIEW COMPARISON:  05/17/2015 FINDINGS: The heart size and mediastinal contours are within normal limits. Atherosclerotic calcification of the aortic knob. No focal airspace consolidation, pleural effusion, or pneumothorax. The visualized skeletal structures are unremarkable. IMPRESSION: No active disease. Electronically Signed   By: Davina Poke D.O.   On: 02/19/2021 15:37    Procedures Procedures   Medications Ordered in ED Medications - No data to display  ED Course  I have reviewed the triage vital signs and the nursing notes.  Pertinent labs & imaging results that were available during my care of the patient were reviewed by me and considered in my medical decision making (see chart for details).    MDM Rules/Calculators/A&P                          Patient presented to the ED for evaluation of general malaise as well as some palpitations but no chest pain or chest pressure.  Patient went to an urgent care and they noted some abnormalities in his EKG.  Patient was sent to the ED for further evaluation patient's spouse is a physician and she was also concerned about EKG findings.  Patient denies having any chest pain or  shortness of breath.  He is not having any abdominal pain.  He is not having any fevers or chills.  Patient's laboratory tests show normal troponins.  No findings to suggest acute cardiac ischemia.  Patient does  have mild elevation in his LFTs however he is not having abdominal pain.  No vomiting or diarrhea.  Appears stable for discharge with outpatient follow-up with cardiology as well as his primary doctor to follow-up on the LFTs. Final Clinical Impression(s) / ED Diagnoses Final diagnoses:  PVC (premature ventricular contraction)  Palpitation  Abnormal liver enzymes    Rx / DC Orders ED Discharge Orders     None        Dorie Rank, MD 02/19/21 2224

## 2021-02-19 NOTE — Discharge Instructions (Addendum)
Follow-up with your primary care doctor to check on your liver enzymes.  Follow-up with a cardiologist for further evaluation as we discussed.  Return as needed for worsening symptoms

## 2021-02-19 NOTE — ED Triage Notes (Signed)
Sent here from urgent care d/t abnormal EKD. Denies CP, SOB, N/V. Alert and oriented X4. Pt feels "something is off". Reports ringing in ears X2 days

## 2021-02-19 NOTE — ED Notes (Signed)
Patient verbalizes understanding of discharge instructions. Opportunity for questioning and answers were provided. Armband removed by staff, pt discharged from ED ambulatory.   

## 2021-02-19 NOTE — ED Provider Notes (Signed)
Emergency Medicine Provider Triage Evaluation Note  Brian Marshall , a 65 y.o. male  was evaluated in triage.  Pt complains of "feeling off". States his sx have been occurring for about two days. States he has many stressors at this time in his life. Denies any CP or SOB but generally feels weak and has a feeling that he can "feel his heartbeat" in his neck and head intermittently. Reports chronic tinnitus that has worsened the past two day. No numbness, visual changes, abdominal pain, n/v, diaphoresis.   Physical Exam  BP (!) 162/87 (BP Location: Left Arm)   Pulse 79   Temp 98.4 F (36.9 C) (Oral)   Resp 17   Ht 5\' 9"  (1.753 m)   Wt 80.7 kg   SpO2 97%   BMI 26.29 kg/m  Gen:   Awake, no distress   Resp:  Normal effort  MSK:   Moves extremities without difficulty  Other:    Medical Decision Making  Medically screening exam initiated at 1:24 PM.  Appropriate orders placed.  Mico Blyth was informed that the remainder of the evaluation will be completed by another provider, this initial triage assessment does not replace that evaluation, and the importance of remaining in the ED until their evaluation is complete.   Rayna Sexton, PA-C 02/19/21 1325    Carmin Muskrat, MD 02/20/21 [phone]

## 2021-03-09 ENCOUNTER — Telehealth: Payer: Self-pay | Admitting: Family Medicine

## 2021-03-09 NOTE — Telephone Encounter (Signed)
Patient is requesting a new patient appointment with physical. States he takes Wellbutrin but no other chronic issues or medications. I explained that Dr. Raoul Pitch may or may not be able to do a physical at the NP appt. Before scheduling, he would like to know for sure if this NP appt will include a physical. Please advise if okay to schedule. He is aware next available will be in January.

## 2021-03-12 NOTE — Telephone Encounter (Signed)
We can either "promise" to establish him with a physical or chronic conditions. Can not make a promise to both on a new pt establishment appt.

## 2021-03-12 NOTE — Telephone Encounter (Signed)
We will need prior PCP records to confirm.

## 2021-03-12 NOTE — Telephone Encounter (Signed)
Noted. Message already sent to [facility] staff.

## 2021-03-13 NOTE — Telephone Encounter (Signed)
Left voicemail for patient explaining (again) that we cannot promise to do both establish with chronic conditions and med refills as well as physical. Advised him to call our office if he would like to schedule as a new patient.

## 2021-03-13 NOTE — Telephone Encounter (Signed)
Noted  

## 2021-03-13 NOTE — Telephone Encounter (Signed)
Pt called back and was advised of below. He did schedule a new patient visit and was advised it will be to establish care/current concerns at the time of visit/medication mgmt. Pt was advised very unlikely to have cpe at initial visit. Pt understood and agreed. New pt packet mailed and pt advised to return to office.

## 2021-03-27 DIAGNOSIS — Z85820 Personal history of malignant melanoma of skin: Secondary | ICD-10-CM | POA: Diagnosis not present

## 2021-03-27 DIAGNOSIS — D485 Neoplasm of uncertain behavior of skin: Secondary | ICD-10-CM | POA: Diagnosis not present

## 2021-03-27 DIAGNOSIS — B079 Viral wart, unspecified: Secondary | ICD-10-CM | POA: Diagnosis not present

## 2021-03-27 DIAGNOSIS — L570 Actinic keratosis: Secondary | ICD-10-CM | POA: Diagnosis not present

## 2021-03-27 DIAGNOSIS — Z85828 Personal history of other malignant neoplasm of skin: Secondary | ICD-10-CM | POA: Diagnosis not present

## 2021-03-27 DIAGNOSIS — D225 Melanocytic nevi of trunk: Secondary | ICD-10-CM | POA: Diagnosis not present

## 2021-03-27 DIAGNOSIS — L821 Other seborrheic keratosis: Secondary | ICD-10-CM | POA: Diagnosis not present

## 2021-06-04 DIAGNOSIS — R972 Elevated prostate specific antigen [PSA]: Secondary | ICD-10-CM | POA: Diagnosis not present

## 2021-06-04 DIAGNOSIS — Z0000 Encounter for general adult medical examination without abnormal findings: Secondary | ICD-10-CM | POA: Diagnosis not present

## 2021-06-04 DIAGNOSIS — R945 Abnormal results of liver function studies: Secondary | ICD-10-CM | POA: Diagnosis not present

## 2021-06-04 DIAGNOSIS — E559 Vitamin D deficiency, unspecified: Secondary | ICD-10-CM | POA: Diagnosis not present

## 2021-06-04 DIAGNOSIS — R030 Elevated blood-pressure reading, without diagnosis of hypertension: Secondary | ICD-10-CM | POA: Diagnosis not present

## 2021-06-04 DIAGNOSIS — E7800 Pure hypercholesterolemia, unspecified: Secondary | ICD-10-CM | POA: Diagnosis not present

## 2021-06-04 DIAGNOSIS — Z23 Encounter for immunization: Secondary | ICD-10-CM | POA: Diagnosis not present

## 2021-06-04 LAB — BASIC METABOLIC PANEL
BUN: 11 (ref 4–21)
CO2: 28 — AB (ref 13–22)
Chloride: 100 (ref 99–108)
Creatinine: 1.1 (ref 0.6–1.3)
Glucose: 104
Potassium: 4.5 mEq/L (ref 3.5–5.1)
Sodium: 138 (ref 137–147)

## 2021-06-04 LAB — LIPID PANEL
Cholesterol: 259 — AB (ref 0–200)
HDL: 71 — AB (ref 35–70)
LDL Cholesterol: 169
LDl/HDL Ratio: 3.7
Triglycerides: 111 (ref 40–160)

## 2021-06-04 LAB — COMPREHENSIVE METABOLIC PANEL
Albumin: 4.7 (ref 3.5–5.0)
Calcium: 9.6 (ref 8.7–10.7)
eGFR: 74

## 2021-06-04 LAB — PSA: PSA: 6.53

## 2021-07-04 DIAGNOSIS — L988 Other specified disorders of the skin and subcutaneous tissue: Secondary | ICD-10-CM | POA: Diagnosis not present

## 2021-07-04 DIAGNOSIS — D485 Neoplasm of uncertain behavior of skin: Secondary | ICD-10-CM | POA: Diagnosis not present

## 2021-08-22 ENCOUNTER — Telehealth: Payer: Self-pay

## 2021-08-22 NOTE — Telephone Encounter (Signed)
Pt called on 08/22/2021 wanting to speak to [facility], informed pt she was busy at the moment.  Pt was wanting to cancel his appointment 08/29/2021 informed pt I didn't want to be responsible for cancelling his appointment. I informed him that a supervisor will contact him.--KR  Stanislav cell: [phone]

## 2021-08-29 ENCOUNTER — Ambulatory Visit: Payer: BC Managed Care – PPO | Admitting: Family Medicine

## 2021-12-13 DIAGNOSIS — Z85828 Personal history of other malignant neoplasm of skin: Secondary | ICD-10-CM | POA: Diagnosis not present

## 2021-12-13 DIAGNOSIS — L821 Other seborrheic keratosis: Secondary | ICD-10-CM | POA: Diagnosis not present

## 2021-12-13 DIAGNOSIS — L570 Actinic keratosis: Secondary | ICD-10-CM | POA: Diagnosis not present

## 2021-12-13 DIAGNOSIS — Z85820 Personal history of malignant melanoma of skin: Secondary | ICD-10-CM | POA: Diagnosis not present

## 2021-12-13 DIAGNOSIS — L723 Sebaceous cyst: Secondary | ICD-10-CM | POA: Diagnosis not present

## 2022-02-08 ENCOUNTER — Encounter: Payer: Self-pay | Admitting: Family Medicine

## 2022-02-15 ENCOUNTER — Encounter: Payer: BC Managed Care – PPO | Admitting: Family Medicine

## 2022-02-21 NOTE — Progress Notes (Signed)
err

## 2022-03-25 DIAGNOSIS — K759 Inflammatory liver disease, unspecified: Secondary | ICD-10-CM | POA: Diagnosis not present

## 2022-03-25 DIAGNOSIS — E559 Vitamin D deficiency, unspecified: Secondary | ICD-10-CM | POA: Diagnosis not present

## 2022-03-25 DIAGNOSIS — R790 Abnormal level of blood mineral: Secondary | ICD-10-CM | POA: Diagnosis not present

## 2022-03-25 DIAGNOSIS — M109 Gout, unspecified: Secondary | ICD-10-CM | POA: Diagnosis not present

## 2022-03-25 DIAGNOSIS — E7841 Elevated Lipoprotein(a): Secondary | ICD-10-CM | POA: Diagnosis not present

## 2022-03-25 LAB — LIPID PANEL
Cholesterol: 223 — AB (ref 0–200)
HDL: 66 (ref 35–70)
LDL Cholesterol: 143
Triglycerides: 78 (ref 40–160)

## 2022-03-25 LAB — VITAMIN D 25 HYDROXY (VIT D DEFICIENCY, FRACTURES): Vit D, 25-Hydroxy: 39.2

## 2022-03-25 LAB — COMPREHENSIVE METABOLIC PANEL
Albumin: 4.6 (ref 3.5–5.0)
Calcium: 9.7 (ref 8.7–10.7)
Globulin: 2.6
eGFR: 78

## 2022-03-25 LAB — HEPATIC FUNCTION PANEL
ALT: 23 U/L (ref 10–40)
AST: 26 (ref 14–40)
Alkaline Phosphatase: 76 (ref 25–125)
Bilirubin, Total: 0.9

## 2022-03-25 LAB — BASIC METABOLIC PANEL
BUN: 11 (ref 4–21)
CO2: 22 (ref 13–22)
Chloride: 97 — AB (ref 99–108)
Creatinine: 1.1 (ref 0.6–1.3)
Glucose: 86
Potassium: 4.9 mEq/L (ref 3.5–5.1)
Sodium: 136 — AB (ref 137–147)

## 2022-03-25 LAB — CBC AND DIFFERENTIAL
HCT: 42 (ref 41–53)
Hemoglobin: 14.5 (ref 13.5–17.5)
Neutrophils Absolute: 3.1
Platelets: 187 10*3/uL (ref 150–400)
WBC: 5.4

## 2022-03-25 LAB — CBC: RBC: 4.37 (ref 3.87–5.11)

## 2022-03-25 LAB — IRON,TIBC AND FERRITIN PANEL: Ferritin: 465

## 2022-04-29 ENCOUNTER — Encounter: Payer: Self-pay | Admitting: Family Medicine

## 2022-04-29 DIAGNOSIS — E785 Hyperlipidemia, unspecified: Secondary | ICD-10-CM | POA: Insufficient documentation

## 2022-04-29 DIAGNOSIS — Z800 Family history of malignant neoplasm of digestive organs: Secondary | ICD-10-CM | POA: Insufficient documentation

## 2022-04-29 DIAGNOSIS — A630 Anogenital (venereal) warts: Secondary | ICD-10-CM | POA: Insufficient documentation

## 2022-04-29 DIAGNOSIS — F419 Anxiety disorder, unspecified: Secondary | ICD-10-CM | POA: Insufficient documentation

## 2022-04-29 DIAGNOSIS — I10 Essential (primary) hypertension: Secondary | ICD-10-CM | POA: Insufficient documentation

## 2022-04-29 DIAGNOSIS — R972 Elevated prostate specific antigen [PSA]: Secondary | ICD-10-CM | POA: Insufficient documentation

## 2022-04-29 DIAGNOSIS — Z8249 Family history of ischemic heart disease and other diseases of the circulatory system: Secondary | ICD-10-CM | POA: Insufficient documentation

## 2022-04-29 DIAGNOSIS — E559 Vitamin D deficiency, unspecified: Secondary | ICD-10-CM | POA: Insufficient documentation

## 2022-05-07 ENCOUNTER — Encounter: Payer: Self-pay | Admitting: Family Medicine

## 2022-05-07 ENCOUNTER — Ambulatory Visit (INDEPENDENT_AMBULATORY_CARE_PROVIDER_SITE_OTHER): Payer: BC Managed Care – PPO | Admitting: Family Medicine

## 2022-05-07 ENCOUNTER — Telehealth: Payer: Self-pay

## 2022-05-07 VITALS — BP 138/72 | HR 79 | Temp 97.8°F | Ht 69.0 in | Wt 175.0 lb

## 2022-05-07 DIAGNOSIS — Z23 Encounter for immunization: Secondary | ICD-10-CM

## 2022-05-07 DIAGNOSIS — Z7689 Persons encountering health services in other specified circumstances: Secondary | ICD-10-CM

## 2022-05-07 DIAGNOSIS — C4442 Squamous cell carcinoma of skin of scalp and neck: Secondary | ICD-10-CM | POA: Diagnosis not present

## 2022-05-07 DIAGNOSIS — C4359 Malignant melanoma of other part of trunk: Secondary | ICD-10-CM | POA: Diagnosis not present

## 2022-05-07 DIAGNOSIS — F419 Anxiety disorder, unspecified: Secondary | ICD-10-CM | POA: Diagnosis not present

## 2022-05-07 DIAGNOSIS — Z85828 Personal history of other malignant neoplasm of skin: Secondary | ICD-10-CM

## 2022-05-07 NOTE — Progress Notes (Unsigned)
Patient ID: Brian Marshall, male  DOB: 1955-11-06, 66 y.o.   MRN: [id number] Patient Care Team    Relationship Specialty Notifications Start End  Ma Hillock, DO PCP - General Family Medicine  05/07/22   Specialists, Digestive Health  Gastroenterology  04/29/22   Robley Fries, MD Consulting Physician Urology  05/07/22     Chief Complaint  Patient presents with   [place]; pt is fasting    Subjective:  Brian Marshall is a 66 y.o.  male present for new patient establishment. All past medical history, surgical history, allergies, family history, immunizations, medications and social history were updated in the electronic medical record today. All recent labs, ED visits and hospitalizations within the last year were reviewed.  Stress/anxiety: Patient reports he has been prescribed Wellbutrin 150 mg daily for at least a decade.  At that time he was having elevated stress levels secondary to work that was rolling over into his home life.  He reports he is done very well on the medication over the years.  At one point in time it was raised to 300 mg daily, but he did not tolerate that dose well.  He reports he has been on the medication so long he is not certain if he should continue or if he would be okay to cut back on medication.  Skin cancer: Patient has a significant history of melanoma, which sounds like it may have been in situ by description, located on his back.  He has had basal cells removed and squamous cell of his scalp which required Mohs procedure and skin grafts.      05/07/2022    8:21 AM  Depression screen PHQ 2/9  Decreased Interest 0  Down, Depressed, Hopeless 0  PHQ - 2 Score 0  Altered sleeping 0  Tired, decreased energy 0  Change in appetite 0  Feeling bad or failure about yourself  0  Trouble concentrating 0  Moving slowly or fidgety/restless 0  Suicidal thoughts 0  PHQ-9 Score 0      05/07/2022    8:21 AM  GAD 7 :  Generalized Anxiety Score  Nervous, Anxious, on Edge 0  Control/stop worrying 0  Worry too much - different things 0  Trouble relaxing 0  Restless 0  Easily annoyed or irritable 0  Afraid - awful might happen 0  Total GAD 7 Score 0            No data to display          Immunization History  Administered Date(s) Administered   Moderna SARS-COV2 Booster Vaccination 07/10/2020    No results found.  Past Medical History:  Diagnosis Date   Anxiety    Basal cell carcinoma    Colon polyp    Elevated PSA 07/2019   Underwent biopsy and patient reports negative biopsy.   Genital warts    Hyperlipemia    Hypertension    Melanoma ([place]) 2020   back   SCC (squamous cell carcinoma), scalp/neck 2019   Allergies  Allergen Reactions   Quinine Derivatives Other (See Comments)    Generalized weakness.   Past Surgical History:  Procedure Laterality Date   ANKLE SURGERY Left 1988   fx   INGUINAL HERNIA REPAIR Bilateral 1972   MOHS SURGERY  08/2017   scalp scc   PROSTATE BIOPSY  07/2019   Patient reports benign   WRIST SURGERY Left  Family History  Problem Relation Age of Onset   Colon cancer Mother    Cancer Father        cholangiocarcinoma, liver   Diverticulitis Brother    Heart attack Paternal Grandfather    Social History   Social History Narrative   Marital status/children/pets: Married   Education/employment: College-educated, employed as a Furniture conservator/restorer:      -smoke alarm in the home:Yes     - wears seatbelt: Yes     - Feels safe in their relationships: Yes       Allergies as of 05/07/2022       Reactions   Quinine Derivatives Other (See Comments)   Generalized weakness.        Medication List        Accurate as of May 07, 2022 12:31 PM. If you have any questions, ask your nurse or doctor.          STOP taking these medications    LORazepam 0.5 MG tablet Commonly known as: ATIVAN Stopped by: Howard Pouch, DO    melatonin 5 MG Tabs Stopped by: Howard Pouch, DO       TAKE these medications    buPROPion 150 MG 12 hr tablet Commonly known as: WELLBUTRIN SR Take by mouth.   FISH OIL PO Take 900 mg by mouth daily.   Vitamin D 50 MCG (2000 UT) tablet Take 2,000 Units by mouth daily.        All past medical history, surgical history, allergies, family history, immunizations andmedications were updated in the EMR today and reviewed under the history and medication portions of their EMR.    No results found for this or any previous visit (from the past 2160 hour(s)).   ROS 14 pt review of systems performed and negative (unless mentioned in an HPI)  Objective: BP 138/72   Pulse 79   Temp 97.8 F (36.6 C) (Oral)   Ht '5\' 9"'$  (1.753 m)   Wt 175 lb (79.4 kg)   SpO2 98%   BMI 25.84 kg/m  Physical Exam Vitals and nursing note reviewed.  Constitutional:      General: He is not in acute distress.    Appearance: Normal appearance. He is not ill-appearing, toxic-appearing or diaphoretic.  HENT:     Head: Atraumatic.  Eyes:     General: No scleral icterus.       Right eye: No discharge.        Left eye: No discharge.     Extraocular Movements: Extraocular movements intact.     Pupils: Pupils are equal, round, and reactive to light.  Cardiovascular:     Rate and Rhythm: Normal rate and regular rhythm.     Heart sounds: No murmur heard. Pulmonary:     Effort: Pulmonary effort is normal. No respiratory distress.     Breath sounds: Normal breath sounds. No wheezing, rhonchi or rales.  Musculoskeletal:     Right lower leg: No edema.     Left lower leg: No edema.  Skin:    General: Skin is warm and dry.     Coloration: Skin is not jaundiced or pale.     Findings: No rash.  Neurological:     Mental Status: He is alert and oriented to person, place, and time. Mental status is at baseline.  Psychiatric:        Mood and Affect: Mood normal.        Behavior: Behavior normal.  Thought Content: Thought content normal.        Judgment: Judgment normal.     Assessment/plan: Thedford Bunton is a 66 y.o. male present for est care Establishing care with new doctor, encounter for Anxiety ***   Need for immunization against influenza Influenza vaccine declined   No follow-ups on file.  Orders Placed This Encounter  Procedures   HM COLONOSCOPY   No orders of the defined types were placed in this encounter.  Referral Orders  No referral(s) requested today     Note is dictated utilizing voice recognition software. Although note has been proof read prior to signing, occasional typographical errors still can be missed. If any questions arise, please do not hesitate to call for verification.  Electronically signed by: Howard Pouch, DO [place]

## 2022-05-07 NOTE — Telephone Encounter (Signed)
FYI

## 2022-05-07 NOTE — Telephone Encounter (Signed)
Noted  

## 2022-05-07 NOTE — Telephone Encounter (Signed)
Patient was seen this morning by Dr. Raoul Pitch.  She told patient to call back regarding medication.  Patient states medication is Wellbutrin XL

## 2022-05-07 NOTE — Patient Instructions (Addendum)
Please check your Wellbutrin label when you get home and let me know if states SR or XL formulation.   No follow-ups on file.        Great to see you today.  I have refilled the medication(s) we provide.   If labs were collected, we will inform you of lab results once received either by echart message or telephone call.   - echart message- for normal results that have been seen by the patient already.   - telephone call: abnormal results or if patient has not viewed results in their echart.

## 2022-05-09 MED ORDER — BUPROPION HCL ER (XL) 150 MG PO TB24: 150.0000 mg | ORAL_TABLET | Freq: Every day | ORAL | 3 refills | Status: DC

## 2022-05-09 NOTE — Telephone Encounter (Signed)
As requested from patient, he messaged [place] to let us know that his Wellbutrin is 150 XL.  We discussed during the office visit to consider lowering the dose.  I then received a message that he needed a refill on his Wellbutrin 150 XL.   Therefore, I do want some clarification before I send this medication in today if he wants to try the lower dose or if he wants to stay on the Wellbutrin 150 XL.  The next dose down would be Wellbutrin 75 mg, but it is only available in the every 12 hour formulation.

## 2022-05-09 NOTE — Telephone Encounter (Signed)
FYI

## 2022-05-09 NOTE — Telephone Encounter (Signed)
Pt do not want to switch and stay on the 150 mg XL

## 2022-05-09 NOTE — Telephone Encounter (Signed)
Patient needs prescription sent for Wellbutrin XL [place].  buPROPion (WELLBUTRIN XL) 150 MG 24 hr tablet [[id number]

## 2022-06-19 DIAGNOSIS — Z85820 Personal history of malignant melanoma of skin: Secondary | ICD-10-CM | POA: Diagnosis not present

## 2022-06-19 DIAGNOSIS — Z85828 Personal history of other malignant neoplasm of skin: Secondary | ICD-10-CM | POA: Diagnosis not present

## 2022-06-19 DIAGNOSIS — L821 Other seborrheic keratosis: Secondary | ICD-10-CM | POA: Diagnosis not present

## 2022-06-19 DIAGNOSIS — L720 Epidermal cyst: Secondary | ICD-10-CM | POA: Diagnosis not present

## 2022-08-18 IMAGING — CR DG CHEST 1V
1 series · 1 of 1 positions shown · non-contrast
Comparison: 05/17/2015

CLINICAL DATA: Abnormal EKG

EXAM:
CHEST  1 VIEW

[chest pa]
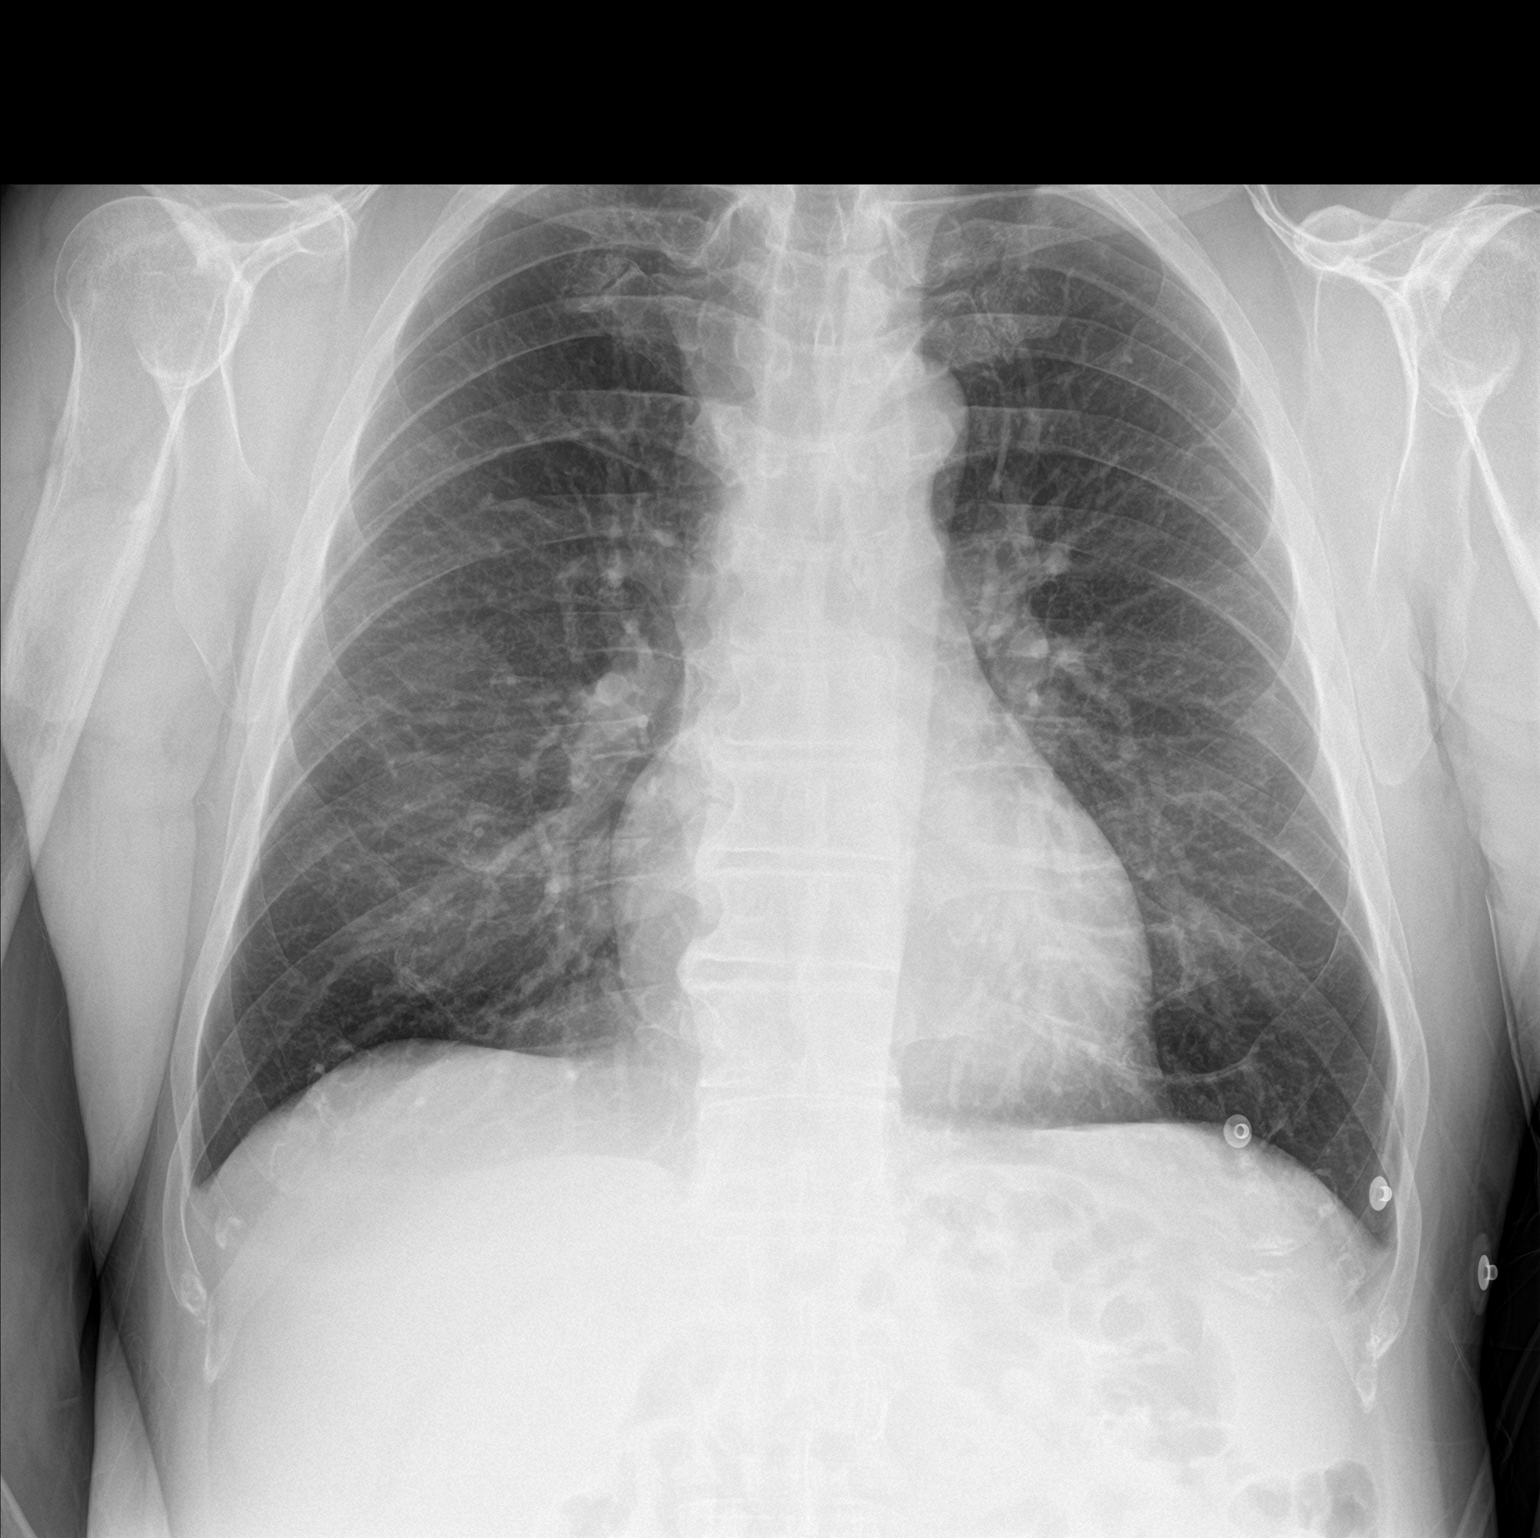

[1 of 1 positions shown; findings below may reference images not displayed]

FINDINGS: The heart size and mediastinal contours are within normal limits.
Atherosclerotic calcification of the aortic knob. No focal airspace
consolidation, pleural effusion, or pneumothorax. The visualized
skeletal structures are unremarkable.
IMPRESSION: No active disease.

## 2023-04-09 ENCOUNTER — Other Ambulatory Visit: Payer: Self-pay | Admitting: Family Medicine

## 2023-08-12 DIAGNOSIS — D1801 Hemangioma of skin and subcutaneous tissue: Secondary | ICD-10-CM | POA: Diagnosis not present

## 2023-08-12 DIAGNOSIS — D485 Neoplasm of uncertain behavior of skin: Secondary | ICD-10-CM | POA: Diagnosis not present

## 2023-08-12 DIAGNOSIS — L570 Actinic keratosis: Secondary | ICD-10-CM | POA: Diagnosis not present

## 2023-08-12 DIAGNOSIS — C44519 Basal cell carcinoma of skin of other part of trunk: Secondary | ICD-10-CM | POA: Diagnosis not present

## 2023-08-12 DIAGNOSIS — Z85820 Personal history of malignant melanoma of skin: Secondary | ICD-10-CM | POA: Diagnosis not present

## 2023-08-12 DIAGNOSIS — L821 Other seborrheic keratosis: Secondary | ICD-10-CM | POA: Diagnosis not present

## 2023-08-12 DIAGNOSIS — D225 Melanocytic nevi of trunk: Secondary | ICD-10-CM | POA: Diagnosis not present

## 2023-08-12 DIAGNOSIS — L131 Subcorneal pustular dermatitis: Secondary | ICD-10-CM | POA: Diagnosis not present

## 2023-08-12 DIAGNOSIS — Z85828 Personal history of other malignant neoplasm of skin: Secondary | ICD-10-CM | POA: Diagnosis not present

## 2023-08-12 DIAGNOSIS — L814 Other melanin hyperpigmentation: Secondary | ICD-10-CM | POA: Diagnosis not present

## 2023-10-09 DIAGNOSIS — Z85820 Personal history of malignant melanoma of skin: Secondary | ICD-10-CM | POA: Diagnosis not present

## 2023-10-09 DIAGNOSIS — C44519 Basal cell carcinoma of skin of other part of trunk: Secondary | ICD-10-CM | POA: Diagnosis not present

## 2023-12-11 ENCOUNTER — Ambulatory Visit: Admitting: Family Medicine

## 2023-12-11 ENCOUNTER — Telehealth: Payer: Self-pay

## 2023-12-11 ENCOUNTER — Encounter: Payer: Self-pay | Admitting: Family Medicine

## 2023-12-11 VITALS — BP 142/80 | HR 92 | Temp 98.4°F | Ht 69.0 in | Wt 168.6 lb

## 2023-12-11 DIAGNOSIS — C4359 Malignant melanoma of other part of trunk: Secondary | ICD-10-CM | POA: Diagnosis not present

## 2023-12-11 DIAGNOSIS — Z0000 Encounter for general adult medical examination without abnormal findings: Secondary | ICD-10-CM | POA: Diagnosis not present

## 2023-12-11 DIAGNOSIS — Z131 Encounter for screening for diabetes mellitus: Secondary | ICD-10-CM | POA: Diagnosis not present

## 2023-12-11 DIAGNOSIS — Z125 Encounter for screening for malignant neoplasm of prostate: Secondary | ICD-10-CM | POA: Diagnosis not present

## 2023-12-11 DIAGNOSIS — N50812 Left testicular pain: Secondary | ICD-10-CM | POA: Diagnosis not present

## 2023-12-11 DIAGNOSIS — Z800 Family history of malignant neoplasm of digestive organs: Secondary | ICD-10-CM

## 2023-12-11 DIAGNOSIS — E7800 Pure hypercholesterolemia, unspecified: Secondary | ICD-10-CM | POA: Diagnosis not present

## 2023-12-11 DIAGNOSIS — F419 Anxiety disorder, unspecified: Secondary | ICD-10-CM

## 2023-12-11 DIAGNOSIS — Z130 Encounter for screening for diseases of the blood and blood-forming organs and certain disorders involving the immune mechanism: Secondary | ICD-10-CM

## 2023-12-11 DIAGNOSIS — Z8249 Family history of ischemic heart disease and other diseases of the circulatory system: Secondary | ICD-10-CM | POA: Diagnosis not present

## 2023-12-11 DIAGNOSIS — Z1159 Encounter for screening for other viral diseases: Secondary | ICD-10-CM | POA: Diagnosis not present

## 2023-12-11 DIAGNOSIS — R972 Elevated prostate specific antigen [PSA]: Secondary | ICD-10-CM

## 2023-12-11 DIAGNOSIS — E559 Vitamin D deficiency, unspecified: Secondary | ICD-10-CM

## 2023-12-11 DIAGNOSIS — C4442 Squamous cell carcinoma of skin of scalp and neck: Secondary | ICD-10-CM | POA: Diagnosis not present

## 2023-12-11 LAB — CBC WITH DIFFERENTIAL/PLATELET
Basophils Absolute: 0 10*3/uL (ref 0.0–0.1)
Basophils Relative: 0.6 % (ref 0.0–3.0)
Eosinophils Absolute: 0.1 10*3/uL (ref 0.0–0.7)
Eosinophils Relative: 1.2 % (ref 0.0–5.0)
HCT: 44.9 % (ref 39.0–52.0)
Hemoglobin: 15 g/dL (ref 13.0–17.0)
Lymphocytes Relative: 9.5 % — ABNORMAL LOW (ref 12.0–46.0)
Lymphs Abs: 0.6 10*3/uL — ABNORMAL LOW (ref 0.7–4.0)
MCHC: 33.4 g/dL (ref 30.0–36.0)
MCV: 100.1 fl — ABNORMAL HIGH (ref 78.0–100.0)
Monocytes Absolute: 0.7 10*3/uL (ref 0.1–1.0)
Monocytes Relative: 11 % (ref 3.0–12.0)
Neutro Abs: 4.6 10*3/uL (ref 1.4–7.7)
Neutrophils Relative %: 77.7 % — ABNORMAL HIGH (ref 43.0–77.0)
Platelets: 185 10*3/uL (ref 150.0–400.0)
RBC: 4.48 Mil/uL (ref 4.22–5.81)
RDW: 12.8 % (ref 11.5–15.5)
WBC: 6 10*3/uL (ref 4.0–10.5)

## 2023-12-11 LAB — GAMMA GT: GGT: 32 U/L (ref 7–51)

## 2023-12-11 LAB — COMPREHENSIVE METABOLIC PANEL WITH GFR
ALT: 49 U/L (ref 0–53)
AST: 43 U/L — ABNORMAL HIGH (ref 0–37)
Albumin: 5.1 g/dL (ref 3.5–5.2)
Alkaline Phosphatase: 70 U/L (ref 39–117)
BUN: 8 mg/dL (ref 6–23)
CO2: 30 meq/L (ref 19–32)
Calcium: 9.9 mg/dL (ref 8.4–10.5)
Chloride: 98 meq/L (ref 96–112)
Creatinine, Ser: 1.02 mg/dL (ref 0.40–1.50)
GFR: 77.15 mL/min (ref >60.00)
Glucose, Bld: 109 mg/dL — ABNORMAL HIGH (ref 70–99)
Potassium: 4.9 meq/L (ref 3.5–5.1)
Sodium: 138 meq/L (ref 135–145)
Total Bilirubin: 0.7 mg/dL (ref 0.2–1.2)
Total Protein: 7.7 g/dL (ref 6.0–8.3)

## 2023-12-11 LAB — LIPID PANEL
Cholesterol: 267 mg/dL — ABNORMAL HIGH (ref 0–200)
HDL: 78.1 mg/dL (ref >39.00)
LDL Cholesterol: 172 mg/dL — ABNORMAL HIGH (ref 0–99)
NonHDL: 188.47
Total CHOL/HDL Ratio: 3
Triglycerides: 83 mg/dL (ref 0.0–149.0)
VLDL: 16.6 mg/dL (ref 0.0–40.0)

## 2023-12-11 LAB — HEMOGLOBIN A1C: Hgb A1c MFr Bld: 5.4 % (ref 4.6–6.5)

## 2023-12-11 LAB — PSA, MEDICARE: PSA: 8.42 ng/mL — ABNORMAL HIGH (ref 0.10–4.00)

## 2023-12-11 LAB — TSH: TSH: 2.22 u[IU]/mL (ref 0.35–5.50)

## 2023-12-11 LAB — VITAMIN D 25 HYDROXY (VIT D DEFICIENCY, FRACTURES): VITD: 21.1 ng/mL — ABNORMAL LOW (ref 30.00–100.00)

## 2023-12-11 NOTE — Telephone Encounter (Signed)
 When pts labs are back he would also like a copy mailed to him.

## 2023-12-11 NOTE — Patient Instructions (Signed)

## 2023-12-11 NOTE — Progress Notes (Unsigned)
 Welcome to [employer] preventative AWV  Patient Care Team    Relationship Specialty Notifications Start End  Brian Shope, DO PCP - General Family Medicine  05/07/22   Specialists, Digestive Health  Gastroenterology  04/29/22   [name], MD Consulting Physician Urology  05/07/22     Chief Complaint  Patient presents with   Medicare Wellness    Welcome to medicare; eye exam not complete Brian Marshall Eyewear    History of Present Ilness: Brian Marshall, 68 y.o. , male presents today for welcome Medicare preventative wellness visit.  Patient also has acute complaints to discuss today.  Health maintenance:  Colonoscopy: Completed 08/2017 5-year repeat-digestive health specialist-Dr. Algie Antis Prostate caner screen: PSA collected today-follows with urology, history of elevated PSA Immunizations: declines vaccines.  Infectious disease screening: HIV declined and hepatitis C agreeable to complete today Glaucoma screen: has eye doc, not see this year, will make appt.   Patient has an acute complaint of left sided scrotal pain.  He said it is a "light "pain very mild but constant.  No prominent swelling or erythema reported.  He states he did start at the gym recently.  Onset was about 2 months ago.  He has not bilateral inguinal hernia repair when he was 68 years old.  He denies any urinary changes or ejaculatory changes/pain.  Past medical, surgical, family and social histories reviewed (including experiences with illnesses, hospital stays, operations, injuries, and treatments):  Past Medical History:  Diagnosis Date   Anxiety    Basal cell carcinoma    Colon polyp    Elevated PSA 07/2019   Underwent biopsy and patient reports negative biopsy.   Genital warts    Hyperlipemia    Hypertension    Melanoma ([facility]) 2020   back   SCC (squamous cell carcinoma), scalp/neck 2019   All allergies reviewed Allergies  Allergen Reactions   Quinine Derivatives Other (See Comments)     Generalized weakness.   Past Surgical History:  Procedure Laterality Date   ANKLE SURGERY Left 1988   fx   INGUINAL HERNIA REPAIR Bilateral 1972   MOHS SURGERY  08/2017   scalp scc   PROSTATE BIOPSY  07/2019   Patient reports benign   WRIST SURGERY Left    Family History  Problem Relation Age of Onset   Colon cancer Mother    Cancer Father        cholangiocarcinoma, liver   Diverticulitis Brother    Heart attack Paternal Grandfather    Social History   Social History Narrative   Marital status/children/pets: Married   Education/employment: College-educated, employed as a Radio producer:      -smoke alarm in the home:Yes     - wears seatbelt: Yes     - Feels safe in their relationships: Yes       All medications verified Allergies as of 12/11/2023       Reactions   Quinine Derivatives Other (See Comments)   Generalized weakness.        Medication List        Accurate as of Dec 11, 2023 11:59 PM. If you have any questions, ask your nurse or doctor.          STOP taking these medications    buPROPion  150 MG 24 hr tablet Commonly known as: WELLBUTRIN  XL Stopped by: Napolean Backbone   FISH OIL PO Stopped by: Elvira Langston   Vitamin D  50 MCG (2000 UT) tablet  Stopped by: Napolean Backbone       TAKE these medications    GLYCINE PO Take by mouth.   multivitamin tablet Take 1 tablet by mouth daily.        Depression Screen    12/11/2023    9:00 AM 12/11/2023    8:42 AM 05/07/2022    8:21 AM  Depression screen PHQ 2/9  Decreased Interest 0 0 0  Down, Depressed, Hopeless 0 0 0  PHQ - 2 Score 0 0 0  Altered sleeping   0  Tired, decreased energy   0  Change in appetite   0  Feeling bad or failure about yourself    0  Trouble concentrating   0  Moving slowly or fidgety/restless   0  Suicidal thoughts   0  PHQ-9 Score   0      05/07/2022    8:21 AM  GAD 7 : Generalized Anxiety Score  Nervous, Anxious, on Edge 0  Control/stop  worrying 0  Worry too much - different things 0  Trouble relaxing 0  Restless 0  Easily annoyed or irritable 0  Afraid - awful might happen 0  Total GAD 7 Score 0     Immunizations: Immunization History  Administered Date(s) Administered   Moderna SARS-COV2 Booster Vaccination 07/10/2020    Cognitive Function        12/11/2023    8:50 AM  6CIT Screen  What Year? 0 points  What month? 0 points  What time? 0 points  Count back from 20 0 points  Months in reverse 0 points  Repeat phrase 0 points  Total Score 0 points    Exercise:      Diet: Regular  Functional Status Survey: Get up and go test: steady and less than 20 seconds.  Is the patient deaf or have difficulty hearing?: No Does the patient have difficulty seeing, even when wearing glasses/contacts?: No Does the patient have difficulty concentrating, remembering, or making decisions?: No Does the patient have difficulty walking or climbing stairs?: No Does the patient have difficulty dressing or bathing?: No Does the patient have difficulty doing errands alone such as visiting a doctor's office or shopping?: No        12/11/2023    8:42 AM  Fall Risk   Falls in the past year? 1  Number falls in past yr: 0  Injury with Fall? 0  Follow up Falls evaluation completed   Physical Activity: Sufficiently Active (12/11/2023)   Exercise Vital Sign    Days of Exercise per Week: 5 days    Minutes of Exercise per Session: 60 min    Social Connections: Socially Integrated (12/11/2023)   Social Connection and Isolation Panel [NHANES]    Frequency of Communication with Friends and Family: More than three times a week    Frequency of Social Gatherings with Friends and Family: More than three times a week    Attends Religious Services: More than 4 times per year    Active Member of [employer] or Organizations: No    Attends Engineer, structural: More than 4 times per year    Marital Status: Married   Social Drivers  of Health with Concerns   Tobacco Use: Medium Risk (12/11/2023)   Patient History    Smoking Tobacco Use: Never    Smokeless Tobacco Use: Former    Passive Exposure: Never  Housing: Unknown (12/11/2023)   Housing Stability Vital Sign    Unable to Pay for  Housing in the Last Year: No    Number of Times Moved in the Last Year: Not on file    Homeless in the Last Year: No   Tobacco Use: Medium Risk (12/11/2023)   Patient History    Smoking Tobacco Use: Never    Smokeless Tobacco Use: Former    Passive Exposure: Never   @alcohol @ Garment/textile technologist Visit from 12/11/2023 in [facility]  AUDIT-C Score 1       Advanced Directives: has an advanced directive - a copy HAS NOT been provided.  ROS  Objective   BP (!) 142/80   Pulse 92   Temp 98.4 F (36.9 C)   Ht 5\' 9"  (1.753 m)   Wt 168 lb 9.6 oz (76.5 kg)   SpO2 98%   BMI 24.90 kg/m  Physical Exam Constitutional:      General: He is not in acute distress.    Appearance: Normal appearance. He is not ill-appearing, toxic-appearing or diaphoretic.  HENT:     Head: Normocephalic and atraumatic.     Right Ear: Tympanic membrane, ear canal and external ear normal. There is no impacted cerumen.     Left Ear: Tympanic membrane, ear canal and external ear normal. There is no impacted cerumen.     Nose: Nose normal. No congestion or rhinorrhea.     Mouth/Throat:     Mouth: Mucous membranes are moist.     Pharynx: Oropharynx is clear. No oropharyngeal exudate or posterior oropharyngeal erythema.  Eyes:     General: No scleral icterus.       Right eye: No discharge.        Left eye: No discharge.     Extraocular Movements: Extraocular movements intact.     Pupils: Pupils are equal, round, and reactive to light.  Cardiovascular:     Rate and Rhythm: Normal rate and regular rhythm.     Pulses: Normal pulses.     Heart sounds: Normal heart sounds. No murmur heard.    No friction rub. No gallop.   Pulmonary:     Effort: Pulmonary effort is normal. No respiratory distress.     Breath sounds: Normal breath sounds. No stridor. No wheezing, rhonchi or rales.  Chest:     Chest wall: No tenderness.  Abdominal:     General: Abdomen is flat. Bowel sounds are normal. There is no distension.     Palpations: Abdomen is soft. There is no mass.     Tenderness: There is no abdominal tenderness. There is no right CVA tenderness, left CVA tenderness, guarding or rebound.     Hernia: No hernia is present.  Musculoskeletal:        General: No swelling or tenderness. Normal range of motion.     Cervical back: Normal range of motion and neck supple.     Right lower leg: No edema.     Left lower leg: No edema.  Lymphadenopathy:     Cervical: No cervical adenopathy.  Skin:    General: Skin is warm and dry.     Coloration: Skin is not jaundiced.     Findings: No bruising, lesion or rash.  Neurological:     General: No focal deficit present.     Mental Status: He is alert and oriented to person, place, and time. Mental status is at baseline.     Cranial Nerves: No cranial nerve deficit.     Sensory: No sensory deficit.  Motor: No weakness.     Coordination: Coordination normal.     Gait: Gait normal.     Deep Tendon Reflexes: Reflexes normal.  Psychiatric:        Mood and Affect: Mood normal.        Behavior: Behavior normal.        Thought Content: Thought content normal.        Judgment: Judgment normal.     Hearing Screening   500Hz  1000Hz  2000Hz  4000Hz   Right ear 25 25 25  65  Left ear 25 25 25  50   Vision Screening   Right eye Left eye Both eyes  Without correction 20/40 20/40 20/30   With correction       Assessment and Plan: Jamiere Pankratz is a 68 y.o. patient present today for their welcome to Medicare preventative wellness Exam  and combination appointment to address acute concerns. There are no diagnoses linked to this encounter. AAA screen: completed if male 80- 12  and if ever smoked and patient agreed to screening   EKG screen: Completed only if felt indicated during initial medicare visit and patient agreed to screening  Alcohol abuse screening: completed if indicated STIs screening: Completed if indicated  Vitamin D  deficiency - Vitamin D  (25 hydroxy) Family history of heart disease-elevated blood pressure/elevated LDL Discussed cardiac CT score, and patient will think about this and discuss with his wife. Low-sodium diet encouraged. Routine exercise. Monitor blood pressure at home and if routinely above 135/85 would recommend follow-up and started blood pressure medication.  Malignant melanoma/squamous cell of scalp Follows with dermatology  Prostate cancer screening -Established with urology, elevated PSA in the past - PSA, Medicare Diabetes mellitus screening - Hemoglobin A1c   Welcome to Medicare preventive visit (Primary) - Comprehensive metabolic panel with GFR - Hemoglobin A1c - TSH - PSA, Medicare - CBC w/Diff - Hepatitis C Antibody -Decreased visual acuity encouraged him to follow-up with his ophthalmologist  Elevated LDL cholesterol level Patient understands GGT, apolipoprotein B and lipoprotein a are not part of a normal cholesterol panel and there will likely be an out-of-pocket cost.  He would like to have these labs drawn regardless. - Lipid panel - Gamma GT - Apolipoprotein B - Lipoprotein A (LPA)  Need for hepatitis C screening test - Hepatitis C Antibody  Left testicular pain/elevated PSA Will evaluate with scrotal ultrasound and Doppler-would encourage him to discuss with urology as well - US  SCROTUM W/DOPPLER; Future   Tobacco Use: Medium Risk (12/11/2023)   Patient History    Smoking Tobacco Use: Never    Smokeless Tobacco Use: Former    Passive Exposure: Never   The patient is not currently a tobacco user. Counseling given: Not Answered    Goals      [place]ain My Quality of Life     Notes: would  like to maintain health and is working on weight loss        Goals Addressed             This Visit's Progress    Matintain My Quality of Life       Notes: would like to maintain health and is working on weight loss          I have personally reviewed and noted the following in the patient's chart:   Medical and social history Use of alcohol, tobacco or illicit drugs  Current medications and supplements Functional ability and status Nutritional status Physical activity Advanced directives List of other physicians Hospitalizations,  surgeries, and ER visits in previous 12 months Vitals Screenings to include cognitive, depression, and falls Referrals and appointments  In addition, I have reviewed and discussed with patient certain preventive protocols, quality metrics, and best practice recommendations. A written personalized care plan for preventive services as well as general preventive health recommendations were provided to patient.     Electronically Signed by: Napolean Backbone, DO [facility] [place]

## 2023-12-12 LAB — APOLIPOPROTEIN B: Apolipoprotein B: 114 mg/dL — ABNORMAL HIGH (ref <90)

## 2023-12-14 LAB — LIPOPROTEIN A (LPA): Lipoprotein (a): 16 nmol/L (ref <75)

## 2023-12-14 LAB — HEPATITIS C ANTIBODY: Hepatitis C Ab: NONREACTIVE

## 2023-12-15 ENCOUNTER — Telehealth: Payer: Self-pay | Admitting: Family Medicine

## 2023-12-15 ENCOUNTER — Encounter: Payer: Self-pay | Admitting: Family Medicine

## 2023-12-15 DIAGNOSIS — Z8249 Family history of ischemic heart disease and other diseases of the circulatory system: Secondary | ICD-10-CM

## 2023-12-15 DIAGNOSIS — E7800 Pure hypercholesterolemia, unspecified: Secondary | ICD-10-CM | POA: Insufficient documentation

## 2023-12-15 DIAGNOSIS — E559 Vitamin D deficiency, unspecified: Secondary | ICD-10-CM

## 2023-12-15 NOTE — Telephone Encounter (Signed)
 Pt & Pt's wife given results/recommendations. Pt stated at the time of his visit a referral or additional imaging was discussed in addition to the cardiac CT score. I did not see order for either placed in pt's chart. Please advise.

## 2023-12-15 NOTE — Telephone Encounter (Signed)
 Labs printed and but in box for mail out.

## 2023-12-15 NOTE — Addendum Note (Signed)
 Addended by: Napolean Backbone A on: 12/15/2023 04:15 PM   Modules accepted: Orders

## 2023-12-15 NOTE — Telephone Encounter (Signed)
 Please call patient Electrolytes and blood cell counts are normal. Kidney function and thyroid function are normal. A1c/diabetes screening is 5.4, fasting glucose was mildly elevated at 109 (if fasting 6-9 hrs) with his A1c at 5.4, This is normal over a 3 months period.   His total cholesterol appears high at 267.  His HDL/good cholesterol is excellent at 78.  Lipoprotein (a) excellent at 16. his LDL/bad cholesterol is higher than desired at 172.  His cholesterol ratio is still great at 3 which is reassuring.  His apolipoprotein B is mildly elevated at 114 (<90 goal).  With the high LDL and mildly elevated apolipoprotein B, this is a borderline reading despite the good cholesterol ratio.  I would recommend he consider proceeding with the cardiac CT score we discussed, that would give him a better understanding of his cardiovascular risk and if starting a statin medication would be warranted for him for cardiovascular protection.  Abnormal PSA: PSA is elevated to 8.42-he is established with urology, I would recommend he discuss the elevated PSA with them.  Vitamin D : Is low at 21.1, recommend starting OTC vitamin D  1000 units daily with food.

## 2023-12-15 NOTE — Telephone Encounter (Signed)
 I have placed the orders for the cardiac CT and the ultrasound as we discussed.  I note has not been closed and that is why it could not be visualized.  Was there a question surrounding the studies?

## 2023-12-16 NOTE — Telephone Encounter (Signed)
Pt notified orders were placed

## 2024-02-12 DIAGNOSIS — Z860101 Personal history of adenomatous and serrated colon polyps: Secondary | ICD-10-CM | POA: Diagnosis not present

## 2024-02-12 DIAGNOSIS — K635 Polyp of colon: Secondary | ICD-10-CM | POA: Diagnosis not present

## 2024-02-12 DIAGNOSIS — Z09 Encounter for follow-up examination after completed treatment for conditions other than malignant neoplasm: Secondary | ICD-10-CM | POA: Diagnosis not present

## 2024-02-12 DIAGNOSIS — D123 Benign neoplasm of transverse colon: Secondary | ICD-10-CM | POA: Diagnosis not present

## 2024-02-12 DIAGNOSIS — Z1211 Encounter for screening for malignant neoplasm of colon: Secondary | ICD-10-CM | POA: Diagnosis not present

## 2024-02-12 DIAGNOSIS — K648 Other hemorrhoids: Secondary | ICD-10-CM | POA: Diagnosis not present

## 2024-06-16 DIAGNOSIS — H02054 Trichiasis without entropian left upper eyelid: Secondary | ICD-10-CM | POA: Diagnosis not present

## 2024-06-16 DIAGNOSIS — Z0100 Encounter for examination of eyes and vision without abnormal findings: Secondary | ICD-10-CM | POA: Diagnosis not present

## 2024-06-16 DIAGNOSIS — H2513 Age-related nuclear cataract, bilateral: Secondary | ICD-10-CM | POA: Diagnosis not present

## 2024-07-28 DIAGNOSIS — L905 Scar conditions and fibrosis of skin: Secondary | ICD-10-CM | POA: Diagnosis not present

## 2024-07-28 DIAGNOSIS — C44311 Basal cell carcinoma of skin of nose: Secondary | ICD-10-CM | POA: Diagnosis not present

## 2024-07-28 DIAGNOSIS — Z85820 Personal history of malignant melanoma of skin: Secondary | ICD-10-CM | POA: Diagnosis not present

## 2024-07-28 DIAGNOSIS — D485 Neoplasm of uncertain behavior of skin: Secondary | ICD-10-CM | POA: Diagnosis not present

## 2024-07-28 DIAGNOSIS — C4442 Squamous cell carcinoma of skin of scalp and neck: Secondary | ICD-10-CM | POA: Diagnosis not present

## 2024-07-28 DIAGNOSIS — L814 Other melanin hyperpigmentation: Secondary | ICD-10-CM | POA: Diagnosis not present

## 2024-07-28 DIAGNOSIS — L131 Subcorneal pustular dermatitis: Secondary | ICD-10-CM | POA: Diagnosis not present

## 2024-07-28 DIAGNOSIS — Z85828 Personal history of other malignant neoplasm of skin: Secondary | ICD-10-CM | POA: Diagnosis not present

## 2024-07-28 DIAGNOSIS — D225 Melanocytic nevi of trunk: Secondary | ICD-10-CM | POA: Diagnosis not present

## 2024-07-28 DIAGNOSIS — C44712 Basal cell carcinoma of skin of right lower limb, including hip: Secondary | ICD-10-CM | POA: Diagnosis not present
